# Patient Record
Sex: Male | Born: 1940 | Race: White | Hispanic: No | Marital: Single | Smoking: Former smoker
Health system: Southern US, Community
[De-identification: ages and names within clinical notes are randomized; demographics above are authoritative.]

## PROBLEM LIST (undated history)

## (undated) DIAGNOSIS — I10 Essential (primary) hypertension: Secondary | ICD-10-CM

## (undated) DIAGNOSIS — C140 Malignant neoplasm of pharynx, unspecified: Secondary | ICD-10-CM

## (undated) DIAGNOSIS — R7303 Prediabetes: Secondary | ICD-10-CM

## (undated) DIAGNOSIS — E785 Hyperlipidemia, unspecified: Secondary | ICD-10-CM

## (undated) DIAGNOSIS — I2510 Atherosclerotic heart disease of native coronary artery without angina pectoris: Secondary | ICD-10-CM

## (undated) HISTORY — PX: THROAT SURGERY: SHX803

## (undated) HISTORY — DX: Prediabetes: R73.03

## (undated) HISTORY — DX: Atherosclerotic heart disease of native coronary artery without angina pectoris: I25.10

---

## 2003-01-24 ENCOUNTER — Emergency Department (HOSPITAL_COMMUNITY): Admission: EM | Admit: 2003-01-24 | Discharge: 2003-01-24 | Payer: Self-pay | Admitting: Emergency Medicine

## 2012-04-25 ENCOUNTER — Encounter (HOSPITAL_COMMUNITY): Payer: Self-pay | Admitting: Emergency Medicine

## 2012-04-25 ENCOUNTER — Emergency Department (HOSPITAL_COMMUNITY)
Admission: EM | Admit: 2012-04-25 | Discharge: 2012-04-25 | Disposition: A | Discharge status: Home / Self Care | Payer: No Typology Code available for payment source | Attending: Emergency Medicine | Admitting: Emergency Medicine

## 2012-04-25 ENCOUNTER — Emergency Department (HOSPITAL_COMMUNITY): Payer: No Typology Code available for payment source

## 2012-04-25 DIAGNOSIS — M542 Cervicalgia: Secondary | ICD-10-CM | POA: Insufficient documentation

## 2012-04-25 DIAGNOSIS — Z79899 Other long term (current) drug therapy: Secondary | ICD-10-CM | POA: Insufficient documentation

## 2012-04-25 DIAGNOSIS — M5380 Other specified dorsopathies, site unspecified: Secondary | ICD-10-CM | POA: Insufficient documentation

## 2012-04-25 DIAGNOSIS — I10 Essential (primary) hypertension: Secondary | ICD-10-CM | POA: Insufficient documentation

## 2012-04-25 DIAGNOSIS — M62830 Muscle spasm of back: Secondary | ICD-10-CM

## 2012-04-25 DIAGNOSIS — Z87891 Personal history of nicotine dependence: Secondary | ICD-10-CM | POA: Insufficient documentation

## 2012-04-25 DIAGNOSIS — E785 Hyperlipidemia, unspecified: Secondary | ICD-10-CM | POA: Insufficient documentation

## 2012-04-25 DIAGNOSIS — Z8589 Personal history of malignant neoplasm of other organs and systems: Secondary | ICD-10-CM | POA: Insufficient documentation

## 2012-04-25 DIAGNOSIS — M62838 Other muscle spasm: Secondary | ICD-10-CM | POA: Insufficient documentation

## 2012-04-25 HISTORY — DX: Essential (primary) hypertension: I10

## 2012-04-25 HISTORY — DX: Malignant neoplasm of pharynx, unspecified: C14.0

## 2012-04-25 HISTORY — DX: Hyperlipidemia, unspecified: E78.5

## 2012-04-25 MED ORDER — OXYCODONE-ACETAMINOPHEN 5-325 MG PO TABS: 1.0000 | ORAL_TABLET | ORAL | Status: AC | PRN

## 2012-04-25 MED ORDER — DIAZEPAM 5 MG PO TABS: 5.0000 mg | ORAL_TABLET | Freq: Four times a day (QID) | ORAL | Status: AC | PRN
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
## 2012-04-25 NOTE — ED Provider Notes (Signed)
Medical screening examination/treatment/procedure(s) were performed by non-physician practitioner and as supervising physician I was immediately available for consultation/collaboration.    Labresha Mellor D Choya Tornow, MD 04/25/12 2350 

## 2013-01-08 ENCOUNTER — Telehealth: Payer: Self-pay | Admitting: Family Medicine

## 2013-01-08 MED ORDER — ALPRAZOLAM 0.5 MG PO TABS: 0.5000 mg | ORAL_TABLET | Freq: Three times a day (TID) | ORAL | Status: DC | PRN

## 2013-01-08 NOTE — Telephone Encounter (Signed)
Pt is starting to have swelling in feet, ankle, and legs. Should he go back on HCTZ? He does not need it called in if so, because he has plenty left over from when you stopped him from taking it.

## 2013-01-08 NOTE — Telephone Encounter (Signed)
Tell him to resume HCTZ, but he needs to be seen if this does not improve.

## 2013-01-09 NOTE — Telephone Encounter (Signed)
Pts dtr aware

## 2013-01-10 NOTE — Telephone Encounter (Signed)
done

## 2013-01-26 ENCOUNTER — Encounter: Payer: Self-pay | Admitting: Family Medicine

## 2013-01-26 DIAGNOSIS — E785 Hyperlipidemia, unspecified: Secondary | ICD-10-CM | POA: Insufficient documentation

## 2013-01-26 DIAGNOSIS — C140 Malignant neoplasm of pharynx, unspecified: Secondary | ICD-10-CM | POA: Insufficient documentation

## 2013-01-26 DIAGNOSIS — I10 Essential (primary) hypertension: Secondary | ICD-10-CM | POA: Insufficient documentation

## 2013-01-26 DIAGNOSIS — I509 Heart failure, unspecified: Secondary | ICD-10-CM | POA: Insufficient documentation

## 2013-01-26 DIAGNOSIS — I2510 Atherosclerotic heart disease of native coronary artery without angina pectoris: Secondary | ICD-10-CM | POA: Insufficient documentation

## 2013-01-28 ENCOUNTER — Ambulatory Visit: Payer: Self-pay | Admitting: Family Medicine

## 2013-02-15 ENCOUNTER — Encounter: Payer: Self-pay | Admitting: Family Medicine

## 2013-02-15 ENCOUNTER — Ambulatory Visit (INDEPENDENT_AMBULATORY_CARE_PROVIDER_SITE_OTHER): Payer: Self-pay | Admitting: Family Medicine

## 2013-02-15 VITALS — BP 142/80 | HR 80 | Temp 98.1°F | Resp 16 | Wt 218.0 lb

## 2013-02-15 DIAGNOSIS — I10 Essential (primary) hypertension: Secondary | ICD-10-CM

## 2013-02-15 NOTE — Progress Notes (Signed)
  Subjective:    Patient ID: Eddie Wilson, male    DOB: 03/16/1941, 72 y.o.   MRN: [id number]  HPI Patient comes in today with fluctuating blood pressures.  He states in between 140-150 systolic in the morning. He then rapidly improves. His blood pressures tend to range 1:30 to 140s over 80s. He denies any chest pain, shortness of breath or dyspnea on exertion. His highest right he takes his medicines. This is always first thing in the morning. This is also right after he wakes up. Past Medical History  Diagnosis Date  . Throat cancer   . Hyperlipemia   . Hypertension   . CAD (coronary artery disease)    Current Outpatient Prescriptions on File Prior to Visit  Medication Sig Dispense Refill  . ALPRAZolam (XANAX) 0.5 MG tablet Take 1 tablet (0.5 mg total) by mouth 3 (three) times daily as needed.  90 tablet  1  . aspirin 325 MG buffered tablet Take 325 mg by mouth daily.      Marland Kitchen docusate sodium (COLACE) 100 MG capsule Take 100 mg by mouth daily.      . hydrochlorothiazide (MICROZIDE) 12.5 MG capsule Take 12.5 mg by mouth daily.      Marland Kitchen lisinopril (PRINIVIL,ZESTRIL) 10 MG tablet Take 10 mg by mouth daily.      . simvastatin (ZOCOR) 20 MG tablet Take 20 mg by mouth every evening.       No current facility-administered medications on file prior to visit.   No Known Allergies    Review of Systems Review of systems is otherwise negative    Objective:   Physical Exam  Constitutional: He appears well-developed and well-nourished.  Cardiovascular: Normal rate and normal heart sounds.   No murmur heard. Pulmonary/Chest: Effort normal and breath sounds normal. No respiratory distress. He has no wheezes. He has no rales. He exhibits no tenderness.  Abdominal: Soft. Bowel sounds are normal.   Patient is status post numerous surgeries on his neck for throat cancer. He has a trach. He has bilateral carotid bruits.  He speaks with an electronic vocal assist device.       Assessment & Plan:  HTN  (hypertension)  Blood pressure is borderline controlled. However given his significant carotid stenosis due to the history of surgery and radiation in the neck I fear low blood pressure more than a mildly elevated blood pressure.  Therefore I asked him to continue his present medications at their current dosages. His goal blood pressure is between 1:30 and 145/80-90.

## 2013-03-13 ENCOUNTER — Telehealth: Payer: Self-pay | Admitting: Family Medicine

## 2013-03-13 ENCOUNTER — Telehealth: Payer: Self-pay | Admitting: *Deleted

## 2013-03-13 NOTE — Telephone Encounter (Signed)
Dtr is aware of message

## 2013-03-13 NOTE — Telephone Encounter (Signed)
Pt is having hairloss now for 2/3 weeks and pt is going crazy over it and wants to know what he can do about it.he talked to a pharmacist and they told him to take iron,vit a  And biotin and still doesn't work. Is there something you can prescribe for him?

## 2013-03-13 NOTE — Telephone Encounter (Signed)
Over the counter rogaine will help slow or stop the hair loss.  If it persists, come in for TSH, ANA and to discuss propecia.

## 2013-03-14 NOTE — Telephone Encounter (Signed)
Pt is aware.  

## 2013-03-14 NOTE — Telephone Encounter (Signed)
otc ogaine will slow hair loss.  If persistant, come in for tsh  And to discuss propecia.

## 2013-03-19 ENCOUNTER — Telehealth: Payer: Self-pay | Admitting: Family Medicine

## 2013-03-19 NOTE — Telephone Encounter (Signed)
?   OK to Refill  

## 2013-03-19 NOTE — Telephone Encounter (Signed)
Ok to refill 

## 2013-03-20 MED ORDER — ALPRAZOLAM 0.5 MG PO TABS: 0.5000 mg | ORAL_TABLET | Freq: Three times a day (TID) | ORAL | Status: DC | PRN

## 2013-03-20 NOTE — Telephone Encounter (Signed)
Rx Refilled  

## 2013-03-21 ENCOUNTER — Encounter: Payer: Self-pay | Admitting: Family Medicine

## 2013-03-21 ENCOUNTER — Ambulatory Visit (INDEPENDENT_AMBULATORY_CARE_PROVIDER_SITE_OTHER): Payer: Self-pay | Admitting: Family Medicine

## 2013-03-21 VITALS — BP 126/90 | HR 78 | Temp 98.0°F | Resp 18 | Wt 200.0 lb

## 2013-03-21 DIAGNOSIS — L658 Other specified nonscarring hair loss: Secondary | ICD-10-CM

## 2013-03-21 DIAGNOSIS — L649 Androgenic alopecia, unspecified: Secondary | ICD-10-CM

## 2013-03-21 NOTE — Progress Notes (Signed)
  Subjective:    Patient ID: Eddie Wilson, male    DOB: 09/26/1941, 72 y.o.   MRN: [id number]  HPI Patient is a 72 year old white male who has noticed increasing hair loss on the crown of his head. He states when he turns his hair he is finding substantial amount of hair in his brush. 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 There is thinning of his hair in the anterior scalp. He is also brought on his crown. However he is reporting some thinning of the hair on his temples as well. He denies any recent weight change, fevers, severe illness, or increased stress. Past Medical History  Diagnosis Date  . Throat cancer   . Hyperlipemia   . Hypertension   . CAD (coronary artery disease)    Current Outpatient Prescriptions on File Prior to Visit  Medication Sig Dispense Refill  . ALPRAZolam (XANAX) 0.5 MG tablet Take 1 tablet (0.5 mg total) by mouth 3 (three) times daily as needed.  90 tablet  0  . aspirin 325 MG buffered tablet Take 325 mg by mouth daily.      Marland Kitchen docusate sodium (COLACE) 100 MG capsule Take 100 mg by mouth daily.      . hydrochlorothiazide (MICROZIDE) 12.5 MG capsule Take 12.5 mg by mouth daily.      Marland Kitchen lisinopril (PRINIVIL,ZESTRIL) 10 MG tablet Take 10 mg by mouth daily.      . metoprolol tartrate (LOPRESSOR) 25 MG tablet Take 25 mg by mouth 2 (two) times daily.      . simvastatin (ZOCOR) 20 MG tablet Take 20 mg by mouth every evening.       No current facility-administered medications on file prior to visit.   No Known Allergies History   Social History  . Marital Status: Single    Spouse Name: N/A    Number of Children: N/A  . Years of Education: N/A   Occupational History  . Not on file.   Social History Main Topics  . Smoking status: Former Games developer  . Smokeless tobacco: Not on file  . Alcohol Use: No  . Drug Use: Not on file  . Sexually Active: Not on file   Other Topics Concern  . Not on file   Social History Narrative  . No narrative on file       Review of Systems  All other systems reviewed and are negative.       Objective:   Physical Exam  Constitutional: He appears well-developed and well-nourished.  Cardiovascular: Normal rate and normal heart sounds.   Pulmonary/Chest: Effort normal and breath sounds normal.  He has thinning hair on the anterior portion of his scalp. He is bald on his occiput.         Assessment & Plan:  1. Androgenic alopecia Recommended that he apply Rogaine to his scalp at night. If the hair loss persists, we can try oral Propecia. Recommend checking a TSH if the hair loss worsens.

## 2013-03-29 ENCOUNTER — Telehealth: Payer: Self-pay | Admitting: Family Medicine

## 2013-03-29 NOTE — Telephone Encounter (Signed)
Pt called back and said nevermind. He misunderstood what the other dr said.

## 2013-04-01 NOTE — Telephone Encounter (Signed)
i would not recommend an ekg machine.  I think a simple bp cuff that can check his pulse would be sufficient.

## 2013-04-04 ENCOUNTER — Telehealth: Payer: Self-pay | Admitting: Family Medicine

## 2013-04-04 NOTE — Telephone Encounter (Signed)
Pt's heart rate had gone up in the 90's and was concerned. Expressed to pt that 60-100 was normal range however if it gets over 100 and he feels really bad/weird please seek medical attention ASAP.

## 2013-04-24 ENCOUNTER — Telehealth: Payer: Self-pay | Admitting: Family Medicine

## 2013-04-24 NOTE — Telephone Encounter (Signed)
Approved. # 90 plus one additional refill 

## 2013-04-24 NOTE — Telephone Encounter (Signed)
?   OK to Refill  

## 2013-04-25 MED ORDER — ALPRAZOLAM 0.5 MG PO TABS: 0.5000 mg | ORAL_TABLET | Freq: Three times a day (TID) | ORAL | Status: DC | PRN

## 2013-04-25 NOTE — Telephone Encounter (Signed)
Rx Refilled  

## 2013-05-20 ENCOUNTER — Encounter: Payer: Self-pay | Admitting: Family Medicine

## 2013-05-20 ENCOUNTER — Ambulatory Visit (INDEPENDENT_AMBULATORY_CARE_PROVIDER_SITE_OTHER): Payer: Medicare Other | Admitting: Family Medicine

## 2013-05-20 VITALS — BP 110/68 | HR 86 | Temp 98.2°F | Resp 20 | Wt 220.0 lb

## 2013-05-20 DIAGNOSIS — J209 Acute bronchitis, unspecified: Secondary | ICD-10-CM

## 2013-05-20 DIAGNOSIS — L570 Actinic keratosis: Secondary | ICD-10-CM

## 2013-05-20 NOTE — Progress Notes (Signed)
Subjective:    Patient ID: Eddie Wilson, male    DOB: 1941/06/06, 72 y.o.   MRN: [id number]  HPI Patient reports one week of cough. His cough is productive of a trace amount of hemoptysis which is common for this patient when he gets bronchitis due to his history of throat cancer and radiation to his neck. He denies any fever, shortness of breath, chest pain, pleurisy, wheezing. He's been getting better since taking guaifenesin.  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 This was performed 3 weeks ago. However a lesion on his right foot on and on the dorsum of his left hand have persisted. 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 Past Medical History  Diagnosis Date  . Throat cancer   . Hyperlipemia   . Hypertension   . CAD (coronary artery disease)    Current Outpatient Prescriptions on File Prior to Visit  Medication Sig Dispense Refill  . ALPRAZolam (XANAX) 0.5 MG tablet Take 1 tablet (0.5 mg total) by mouth 3 (three) times daily as needed.  90 tablet  1  . aspirin 325 MG buffered tablet Take 325 mg by mouth daily.      Marland Kitchen docusate sodium (COLACE) 100 MG capsule Take 100 mg by mouth daily.      . hydrochlorothiazide (MICROZIDE) 12.5 MG capsule Take 12.5 mg by mouth daily.      Marland Kitchen lisinopril (PRINIVIL,ZESTRIL) 10 MG tablet Take 10 mg by mouth daily.      . metoprolol tartrate (LOPRESSOR) 25 MG tablet Take 25 mg by mouth 2 (two) times daily.      . simvastatin (ZOCOR) 20 MG tablet Take 20 mg by mouth every evening.       No current facility-administered medications on file prior to visit.   No Known Allergies History   Social History  . Marital Status: Single    Spouse Name: N/A    Number of Children: N/A  . Years of Education: N/A   Occupational History  . Not on file.   Social History Main Topics   . Smoking status: Former Games developer  . Smokeless tobacco: Not on file  . Alcohol Use: No  . Drug Use: Not on file  . Sexually Active: Not on file   Other Topics Concern  . Not on file   Social History Narrative  . No narrative on file      Review of Systems  All other systems reviewed and are negative.       Objective:   Physical Exam  Vitals reviewed. Constitutional: He appears well-developed and well-nourished.  HENT:  Right Ear: External ear normal.  Left Ear: External ear normal.  Nose: Nose normal.  Mouth/Throat: Oropharynx is clear and moist. No oropharyngeal exudate.  Neck: Neck supple. No thyromegaly present.  Cardiovascular: Normal rate, regular rhythm and normal heart sounds.   No murmur heard. Pulmonary/Chest: Effort normal and breath sounds normal. No respiratory distress. He has no wheezes. He has no rales. He exhibits no tenderness.  Lymphadenopathy:    He has no cervical adenopathy.   4 mm right scaly papule on the dorsum of the left hand. 4 mm hard right scaly papule on the dorsum of the right forearm.       Assessment & Plan:  1. Actinic keratosis Cryotherapy was applied to each of the  2 lesions described above in the physical exam total of 20 seconds each. Wound care was discussed. If the lesions persist they will require a shave biopsy  2. Acute bronchitis Improving. Likely viral in etiology. Recommended Mucinex 600 mg tablets one to 2 tablets by mouth every 12 hours when necessary cough. He is to follow up if he develops fever chest pain or shortness of breath or if symptoms persist. The patient reports he had a normal chest x-ray yesterday at the [place].

## 2013-05-28 ENCOUNTER — Telehealth: Payer: Self-pay | Admitting: Family Medicine

## 2013-05-28 MED ORDER — PREDNISONE 20 MG PO TABS: ORAL_TABLET | ORAL | Status: DC

## 2013-05-28 NOTE — Telephone Encounter (Signed)
Med sent to pharm. And pt aware

## 2013-05-28 NOTE — Telephone Encounter (Signed)
Ok with prednisone taper pack. 
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
## 2013-06-27 ENCOUNTER — Encounter: Payer: Self-pay | Admitting: Family Medicine

## 2013-06-27 ENCOUNTER — Ambulatory Visit (INDEPENDENT_AMBULATORY_CARE_PROVIDER_SITE_OTHER): Payer: Medicare Other | Admitting: Family Medicine

## 2013-06-27 VITALS — BP 124/62 | HR 82 | Temp 97.7°F | Resp 18 | Wt 216.0 lb

## 2013-06-27 DIAGNOSIS — D489 Neoplasm of uncertain behavior, unspecified: Secondary | ICD-10-CM

## 2013-06-27 NOTE — Progress Notes (Signed)
  Subjective:    Patient ID: Eddie Wilson, male    DOB: 1941-08-08, 72 y.o.   MRN: [id number]  HPI Patient has had a lesion on his right forearm for over a year. He has received cryotherapy on 2 separate occasions at the [place]. He continues to grow and bleed. It is 1 cm erythematous and scaly. It is located on the dorsal surface of his right forearm. He also has a scaly red macule on the dorsal surface of his right hand that he wants treated. Past Medical History  Diagnosis Date  . Throat cancer   . Hyperlipemia   . Hypertension   . CAD (coronary artery disease)    Current Outpatient Prescriptions on File Prior to Visit  Medication Sig Dispense Refill  . ALPRAZolam (XANAX) 0.5 MG tablet Take 1 tablet (0.5 mg total) by mouth 3 (three) times daily as needed.  90 tablet  1  . aspirin 325 MG buffered tablet Take 325 mg by mouth daily.      Marland Kitchen docusate sodium (COLACE) 100 MG capsule Take 100 mg by mouth daily.      . hydrochlorothiazide (MICROZIDE) 12.5 MG capsule Take 12.5 mg by mouth daily.      Marland Kitchen lisinopril (PRINIVIL,ZESTRIL) 10 MG tablet Take 10 mg by mouth daily.      . metoprolol tartrate (LOPRESSOR) 25 MG tablet Take 25 mg by mouth 2 (two) times daily.      . predniSONE (DELTASONE) 20 MG tablet 3 tabs po qd x 2 days, 2 tabs po qd x 2 days, 1 tab po qd x 2 days  12 tablet  0  . simvastatin (ZOCOR) 20 MG tablet Take 20 mg by mouth every evening.       No current facility-administered medications on file prior to visit.   No Known Allergies History   Social History  . Marital Status: Single    Spouse Name: N/A    Number of Children: N/A  . Years of Education: N/A   Occupational History  . Not on file.   Social History Main Topics  . Smoking status: Former Games developer  . Smokeless tobacco: Not on file  . Alcohol Use: No  . Drug Use: Not on file  . Sexual Activity: Not on file   Other Topics Concern  . Not on file   Social History Narrative  . No narrative on file      Review of  Systems  All other systems reviewed and are negative.       Objective:   Physical Exam  Vitals reviewed. Constitutional: He appears well-developed and well-nourished.  Cardiovascular: Normal rate and regular rhythm.   Pulmonary/Chest: Effort normal and breath sounds normal. No respiratory distress.   1 cm erythematous scaly papule on the dorsal surface of his right forearm. 7 mm erythematous scaly macule on the dorsal surface of his left hand the        Assessment & Plan:  1. Neoplasm of uncertain behavior The area on the dorsal surface of his right forearm was anesthetized with 0.1% lidocaine with epinephrine. A shave biopsy was performed and the lesion was sent to pathology and labeled container. Hemostasis was achieved with Drysol .The lesion on the dorsal surface of the right hand was treated with cryotherapy with liquid nitrogen for 30 seconds. Wound care was discussed. Await pathology results - Pathology

## 2013-07-03 ENCOUNTER — Telehealth: Payer: Self-pay | Admitting: Family Medicine

## 2013-07-03 NOTE — Telephone Encounter (Signed)
?   OK to Refill  

## 2013-07-04 MED ORDER — ALPRAZOLAM 0.5 MG PO TABS: 0.5000 mg | ORAL_TABLET | Freq: Three times a day (TID) | ORAL | Status: DC | PRN

## 2013-07-04 NOTE — Telephone Encounter (Signed)
Ok with 2 refills

## 2013-07-04 NOTE — Addendum Note (Signed)
Addended by: Legrand Rams B on: 07/04/2013 11:50 AM   Modules accepted: Orders

## 2013-07-04 NOTE — Telephone Encounter (Signed)
Rx Refilled  

## 2013-08-03 ENCOUNTER — Telehealth: Payer: Self-pay | Admitting: Family Medicine

## 2013-08-03 NOTE — Telephone Encounter (Signed)
Alprazolam 0.5mg  take one tablet by mouth TID prn #90

## 2013-08-04 NOTE — Telephone Encounter (Signed)
ok 

## 2013-08-05 ENCOUNTER — Telehealth: Payer: Self-pay | Admitting: Family Medicine

## 2013-08-05 MED ORDER — ALPRAZOLAM 0.5 MG PO TABS: 0.5000 mg | ORAL_TABLET | Freq: Three times a day (TID) | ORAL | Status: DC | PRN

## 2013-08-05 NOTE — Telephone Encounter (Signed)
Alprazolam 0.5 mg TID PRN.  Last RF 07/04/13  #90  OK Refill?

## 2013-08-05 NOTE — Telephone Encounter (Signed)
Rx called in 

## 2013-08-05 NOTE — Telephone Encounter (Signed)
ok 

## 2013-10-07 ENCOUNTER — Other Ambulatory Visit: Payer: Self-pay | Admitting: Family Medicine

## 2013-10-07 MED ORDER — ALPRAZOLAM 0.5 MG PO TABS: 0.5000 mg | ORAL_TABLET | Freq: Three times a day (TID) | ORAL | Status: DC | PRN

## 2013-10-07 NOTE — Telephone Encounter (Signed)
Rx Refilled  

## 2013-11-21 ENCOUNTER — Telehealth: Payer: Self-pay | Admitting: Family Medicine

## 2013-11-21 MED ORDER — LISINOPRIL 10 MG PO TABS: 10.0000 mg | ORAL_TABLET | Freq: Every day | ORAL | Status: DC

## 2013-11-21 NOTE — Telephone Encounter (Signed)
Pt is needing a refill on his Lisinopril 10 MG Call back number is [place] on [address]

## 2013-11-21 NOTE — Telephone Encounter (Signed)
Rx Refilled  

## 2013-12-20 ENCOUNTER — Telehealth: Payer: Self-pay | Admitting: Family Medicine

## 2013-12-20 MED ORDER — ALPRAZOLAM 0.5 MG PO TABS: 0.5000 mg | ORAL_TABLET | Freq: Three times a day (TID) | ORAL | Status: DC | PRN

## 2013-12-20 NOTE — Telephone Encounter (Signed)
ok 

## 2013-12-20 NOTE — Telephone Encounter (Signed)
Last Rf Alprazolam 12/15 #90 +2.  Last OV 05/20/13.

## 2013-12-20 NOTE — Telephone Encounter (Signed)
Rx printed and faxed to pharmacy. 

## 2014-01-06 ENCOUNTER — Ambulatory Visit: Payer: Self-pay | Admitting: Family Medicine

## 2014-01-27 ENCOUNTER — Ambulatory Visit: Payer: Self-pay | Admitting: Family Medicine

## 2014-01-31 ENCOUNTER — Encounter: Payer: Self-pay | Admitting: Family Medicine

## 2014-01-31 ENCOUNTER — Ambulatory Visit (INDEPENDENT_AMBULATORY_CARE_PROVIDER_SITE_OTHER): Payer: Medicare Other | Admitting: Family Medicine

## 2014-01-31 VITALS — BP 126/64 | HR 72 | Temp 97.0°F | Resp 18 | Ht 72.0 in | Wt 218.0 lb

## 2014-01-31 DIAGNOSIS — L0390 Cellulitis, unspecified: Secondary | ICD-10-CM

## 2014-01-31 DIAGNOSIS — L0291 Cutaneous abscess, unspecified: Secondary | ICD-10-CM

## 2014-01-31 DIAGNOSIS — J309 Allergic rhinitis, unspecified: Secondary | ICD-10-CM

## 2014-01-31 MED ORDER — FLUTICASONE PROPIONATE 50 MCG/ACT NA SUSP: 2.0000 | Freq: Every day | NASAL | Status: DC

## 2014-01-31 MED ORDER — SULFAMETHOXAZOLE-TMP DS 800-160 MG PO TABS: 1.0000 | ORAL_TABLET | Freq: Two times a day (BID) | ORAL | Status: DC

## 2014-01-31 NOTE — Progress Notes (Signed)
   Subjective:    Patient ID: Eddie Wilson, male    DOB: 05-07-1941, 73 y.o.   MRN: [id number]  HPI Patient has been having itchy watery eyes and rhinorrhea and nasal congestion for several weeks. He also has a tender erythematous nodule at the entrance of his left nostril. There is no fluctuance. There is no obvious abscess. It does appear infected like it could be an evolving abscess.  He is 5 mm in size and extremely tender to palpation. Past Medical History  Diagnosis Date  . Throat cancer   . Hyperlipemia   . Hypertension   . CAD (coronary artery disease)    Current Outpatient Prescriptions on File Prior to Visit  Medication Sig Dispense Refill  . ALPRAZolam (XANAX) 0.5 MG tablet Take 1 tablet (0.5 mg total) by mouth 3 (three) times daily as needed.  90 tablet  2  . aspirin 325 MG buffered tablet Take 325 mg by mouth daily.      Marland Kitchen docusate sodium (COLACE) 100 MG capsule Take 100 mg by mouth daily.      . hydrochlorothiazide (MICROZIDE) 12.5 MG capsule Take 12.5 mg by mouth daily.      Marland Kitchen lisinopril (PRINIVIL,ZESTRIL) 10 MG tablet Take 1 tablet (10 mg total) by mouth daily.  30 tablet  11  . metoprolol tartrate (LOPRESSOR) 25 MG tablet Take 25 mg by mouth 2 (two) times daily.      . simvastatin (ZOCOR) 20 MG tablet Take 20 mg by mouth every evening.       No current facility-administered medications on file prior to visit.   No Known Allergies History   Social History  . Marital Status: Single    Spouse Name: N/A    Number of Children: N/A  . Years of Education: N/A   Occupational History  . Not on file.   Social History Main Topics  . Smoking status: Former Research scientist (life sciences)  . Smokeless tobacco: Not on file  . Alcohol Use: No  . Drug Use: Not on file  . Sexual Activity: Not on file   Other Topics Concern  . Not on file   Social History Narrative  . No narrative on file      Review of Systems  All other systems reviewed and are negative.      Objective:   Physical Exam   Vitals reviewed. Constitutional: He appears well-developed and well-nourished.  HENT:  Head: Normocephalic and atraumatic.  Right Ear: External ear normal.  Left Ear: External ear normal.  Nose: Mucosal edema, rhinorrhea and sinus tenderness present.  Mouth/Throat: Oropharynx is clear and moist. No oropharyngeal exudate.  Tender erythematous 5 mm nodule at the entrance of the left nostril        Assessment & Plan:  1. Cellulitis Warm compresses applied twice a day for the next 5 days. Bactrim strength tablet 1 by mouth twice a day for 7 days. - sulfamethoxazole-trimethoprim (BACTRIM DS) 800-160 MG per tablet; Take 1 tablet by mouth 2 (two) times daily.  Dispense: 14 tablet; Refill: 0  2. Allergic rhinitis - fluticasone (FLONASE) 50 MCG/ACT nasal spray; Place 2 sprays into both nostrils daily.  Dispense: 16 g; Refill: 2

## 2014-03-27 ENCOUNTER — Other Ambulatory Visit: Payer: Self-pay | Admitting: Family Medicine

## 2014-03-27 MED ORDER — METOPROLOL TARTRATE 25 MG PO TABS: 25.0000 mg | ORAL_TABLET | Freq: Two times a day (BID) | ORAL | Status: DC

## 2014-03-28 ENCOUNTER — Telehealth: Payer: Self-pay | Admitting: *Deleted

## 2014-03-28 NOTE — Telephone Encounter (Signed)
Hctz will not likely help localized swelling on the top of the foot.

## 2014-03-28 NOTE — Telephone Encounter (Signed)
Pt called wanting to know if he can increase his HCTZ 12.5mg  d/t Left foot swelling on top of foot wants to increase it to 25mg . MD please advise

## 2014-03-28 NOTE — Telephone Encounter (Signed)
Linda aware.

## 2014-04-22 ENCOUNTER — Telehealth: Payer: Self-pay | Admitting: Family Medicine

## 2014-04-22 ENCOUNTER — Telehealth: Payer: Self-pay | Admitting: *Deleted

## 2014-04-22 MED ORDER — ALPRAZOLAM 0.5 MG PO TABS: 0.5000 mg | ORAL_TABLET | Freq: Three times a day (TID) | ORAL | Status: DC | PRN

## 2014-04-22 NOTE — Telephone Encounter (Signed)
ok 

## 2014-04-22 NOTE — Telephone Encounter (Signed)
Ok to refill Xanax??  Last office visit 01/31/2014.  Last refill 12/20/2013.

## 2014-04-22 NOTE — Telephone Encounter (Signed)
Pharmacy request Rf Alprazolam 0.5 mg.  Last filled per pharm 03/18/14. #90.  OK refill?

## 2014-04-22 NOTE — Telephone Encounter (Signed)
Medication called to pharmacy. 

## 2014-04-23 NOTE — Telephone Encounter (Signed)
Medication called to pharmacy on 04/22/2014.

## 2014-04-28 ENCOUNTER — Ambulatory Visit (INDEPENDENT_AMBULATORY_CARE_PROVIDER_SITE_OTHER): Payer: Medicare Other | Admitting: Family Medicine

## 2014-04-28 ENCOUNTER — Encounter: Payer: Self-pay | Admitting: Family Medicine

## 2014-04-28 VITALS — BP 160/86 | HR 60 | Temp 97.4°F | Resp 18 | Ht 72.0 in | Wt 222.0 lb

## 2014-04-28 DIAGNOSIS — I10 Essential (primary) hypertension: Secondary | ICD-10-CM

## 2014-04-28 NOTE — Progress Notes (Signed)
Subjective:    Patient ID: Eddie Wilson, male    DOB: February 12, 1941, 73 y.o.   MRN: [id number]  HPI Patient has a history of coronary artery disease status post myocardial infarction in the remote past. He recently had a stress test performed at the [facility] that was abnormal. He was referred to the [facility] in [place] where they performed a catheterization.  Per the patient's report his coronary arteries were completely clear.  However at the time his blood pressure was elevated at 180/90 in the cardiologist doubled his hydrochlorothiazide to 25 mg and doubled his lisinopril to 20 mg a day. The patient states he checks his blood pressure times a day on average. His blood pressure typically runs 110-120/50-60. Whenever he gets anxious or nervous his blood pressure can become quite elevated although this is temporary. He states that when he returned home on the higher dose of his blood pressure medication, his blood pressure dropped into the 70's over 40s and he almost passed out.  He is subsequently decreased his hydrochlorothiazide to 12.5 mg a day and lisinopril back to 10 mg a day. His blood pressure here today is elevated at 160/86. I verified this myself. However the patient states that he was rushing to get here on time. He also states that his girlfriend has recently been diagnosed with cancer and this has been very worried. He is adamant that his blood pressure is much better at home. Furthermore he is febrile blood pressure cuffs and they all agree. Past Medical History  Diagnosis Date  . Throat cancer   . Hyperlipemia   . Hypertension   . CAD (coronary artery disease)    Past Surgical History  Procedure Laterality Date  . Throat surgery     Current Outpatient Prescriptions on File Prior to Visit  Medication Sig Dispense Refill  . ALPRAZolam (XANAX) 0.5 MG tablet Take 1 tablet (0.5 mg total) by mouth 3 (three) times daily as needed.  90 tablet  2  . aspirin 325 MG buffered tablet Take 325 mg by  mouth daily.      Marland Kitchen docusate sodium (COLACE) 100 MG capsule Take 100 mg by mouth daily.      . fluticasone (FLONASE) 50 MCG/ACT nasal spray Place 2 sprays into both nostrils daily.  16 g  2  . hydrochlorothiazide (MICROZIDE) 12.5 MG capsule Take 12.5 mg by mouth daily.      Marland Kitchen lisinopril (PRINIVIL,ZESTRIL) 10 MG tablet Take 1 tablet (10 mg total) by mouth daily.  30 tablet  11  . metoprolol tartrate (LOPRESSOR) 25 MG tablet Take 1 tablet (25 mg total) by mouth 2 (two) times daily.  180 tablet  3  . simvastatin (ZOCOR) 20 MG tablet Take 20 mg by mouth every evening.       No current facility-administered medications on file prior to visit.   No Known Allergies History   Social History  . Marital Status: Single    Spouse Name: N/A    Number of Children: N/A  . Years of Education: N/A   Occupational History  . Not on file.   Social History Main Topics  . Smoking status: Former Research scientist (life sciences)  . Smokeless tobacco: Not on file  . Alcohol Use: No  . Drug Use: Not on file  . Sexual Activity: Not on file   Other Topics Concern  . Not on file   Social History Narrative  . No narrative on file      Review of Systems  All other systems reviewed and are negative.      Objective:   Physical Exam  Vitals reviewed. Constitutional: He appears well-developed and well-nourished.  Cardiovascular: Normal rate, regular rhythm and normal heart sounds.   No murmur heard. Pulmonary/Chest: Effort normal and breath sounds normal. No respiratory distress. He has no wheezes. He has no rales.  Abdominal: Soft. Bowel sounds are normal. He exhibits no distension. There is no tenderness. There is no rebound and no guarding.  Musculoskeletal: He exhibits no edema.   patient has chronic surgical changes in his left neck and left jaw from his previous surgery for throat cancer. Electronic voice simulator.  EKG  normal sinus rhythm at 74 beats per minute with normal intervals and normal axis. He does have  delayed R-wave progression. He also has possible left atrial enlargement with a large P-wave in lead 2. There are nonspecific ST changes in lead 3. Otherwise, it is a normal EKG      Assessment & Plan:  1. Essential hypertension Patient's EKG is normal. Right palmar on record to monitor here this practice. His blood pressure today is elevated although the patient states this is an aberration. Patient checked his blood pressure Saturday and was 96/47.  He verified this twice throughout the day. Therefore I will not increase his blood pressure medication at present.  I would like him to monitor his blood pressure closely at home and notify me of the results in 2-3 weeks. His goal blood pressure is less than 140/90.  Patient states that his cholesterol was recently checked at the [facility] was normal. - EKG 12-Lead

## 2014-06-23 ENCOUNTER — Telehealth: Payer: Self-pay | Admitting: Family Medicine

## 2014-06-23 MED ORDER — ALPRAZOLAM 0.5 MG PO TABS: 0.5000 mg | ORAL_TABLET | Freq: Three times a day (TID) | ORAL | Status: DC | PRN

## 2014-06-23 NOTE — Telephone Encounter (Signed)
Med called to pharm and pt aware 

## 2014-06-23 NOTE — Telephone Encounter (Signed)
?   OK to Refill - I think he just needs a refill on this

## 2014-06-23 NOTE — Telephone Encounter (Signed)
Patient needs rx for xanax they took it too one pharmacy and it was too expensive so she needs new rx [phone] [facility]

## 2014-06-23 NOTE — Telephone Encounter (Signed)
ok 

## 2014-07-23 ENCOUNTER — Telehealth: Payer: Self-pay | Admitting: Family Medicine

## 2014-07-23 NOTE — Telephone Encounter (Signed)
Patients daughter is calling in regards to one of his prescriptions please call [phone]

## 2014-07-24 NOTE — Telephone Encounter (Signed)
Lmtrc

## 2014-07-30 ENCOUNTER — Telehealth: Payer: Self-pay | Admitting: Family Medicine

## 2014-07-30 NOTE — Telephone Encounter (Signed)
Spoke to pt and the MD at the [facility] increased his Lisinopril to 10mg  1.5 pills per day and he needs a new [facility]. OK to do? (pt does have an appt Friday)

## 2014-07-30 NOTE — Telephone Encounter (Signed)
[phone]  Eddie Wilson is needing to speak to you about pts medications lisinopril (PRINIVIL,ZESTRIL) 10 MG tablet

## 2014-07-31 MED ORDER — LISINOPRIL 10 MG PO TABS: 15.0000 mg | ORAL_TABLET | Freq: Every day | ORAL | Status: DC

## 2014-07-31 NOTE — Telephone Encounter (Signed)
ok 

## 2014-07-31 NOTE — Telephone Encounter (Signed)
Med sent to pharm for increased dose

## 2014-08-01 ENCOUNTER — Ambulatory Visit (INDEPENDENT_AMBULATORY_CARE_PROVIDER_SITE_OTHER): Payer: Medicare Other | Admitting: Family Medicine

## 2014-08-01 ENCOUNTER — Encounter: Payer: Self-pay | Admitting: Family Medicine

## 2014-08-01 VITALS — BP 118/74 | HR 74 | Temp 98.1°F | Resp 18 | Ht 72.0 in | Wt 217.0 lb

## 2014-08-01 DIAGNOSIS — L0100 Impetigo, unspecified: Secondary | ICD-10-CM

## 2014-08-01 DIAGNOSIS — R059 Cough, unspecified: Secondary | ICD-10-CM

## 2014-08-01 DIAGNOSIS — R05 Cough: Secondary | ICD-10-CM

## 2014-08-01 NOTE — Progress Notes (Signed)
Subjective:    Patient ID: Eddie Wilson, male    DOB: [birth year], 73 y.o.   MRN: [id number]  HPI Patient has an irritated area inside his left nostril for the last week. There is a small papule on the edge of his left nostril which is verruciform in nature.  However the skin around it is erythematous cracked and sore. The lesion in toto is approximately 7 mm in diameter. It appears to be impetigo secondarily infecting some type of skin papule. It is difficult to ascertain what the Pap is due to the medication. It could be a seborrheic keratosis although I cannot exclude neoplasm.  He also complains of chronic rhinorrhea and postnasal drip. He has had a cough productive of clear sputum for the last week. He denies any fever or shortness of breath or chest pain. Past Medical History  Diagnosis Date  . Throat cancer   . Hyperlipemia   . Hypertension   . CAD (coronary artery disease)    Past Surgical History  Procedure Laterality Date  . Throat surgery     Current Outpatient Prescriptions on File Prior to Visit  Medication Sig Dispense Refill  . ALPRAZolam (XANAX) 0.5 MG tablet Take 1 tablet (0.5 mg total) by mouth 3 (three) times daily as needed.  90 tablet  2  . aspirin 325 MG buffered tablet Take 325 mg by mouth daily.      Marland Kitchen docusate sodium (COLACE) 100 MG capsule Take 100 mg by mouth daily.      . fluticasone (FLONASE) 50 MCG/ACT nasal spray Place 2 sprays into both nostrils daily.  16 g  2  . hydrochlorothiazide (MICROZIDE) 12.5 MG capsule Take 12.5 mg by mouth daily.      Marland Kitchen lisinopril (PRINIVIL,ZESTRIL) 10 MG tablet Take 1.5 tablets (15 mg total) by mouth daily.  45 tablet  5  . metoprolol tartrate (LOPRESSOR) 25 MG tablet Take 1 tablet (25 mg total) by mouth 2 (two) times daily.  180 tablet  3  . simvastatin (ZOCOR) 20 MG tablet Take 20 mg by mouth every evening.       No current facility-administered medications on file prior to visit.   No Known Allergies History   Social  History  . Marital Status: Single    Spouse Name: N/A    Number of Children: N/A  . Years of Education: N/A   Occupational History  . Not on file.   Social History Main Topics  . Smoking status: Former Research scientist (life sciences)  . Smokeless tobacco: Not on file  . Alcohol Use: No  . Drug Use: Not on file  . Sexual Activity: Not on file   Other Topics Concern  . Not on file   Social History Narrative  . No narrative on file      Review of Systems  All other systems reviewed and are negative.      Objective:   Physical Exam  Vitals reviewed. HENT:  Nose: Mucosal edema and rhinorrhea present.  Mouth/Throat: Oropharynx is clear and moist.  Neck: Neck supple.  Cardiovascular: Normal rate, regular rhythm and normal heart sounds.   Pulmonary/Chest: Effort normal and breath sounds normal. No respiratory distress. He has no wheezes. He has no rales. He exhibits no tenderness.  Lymphadenopathy:    He has no cervical adenopathy.  Skin: Rash noted. There is erythema.   please see the description of the lesion in the left nostril in the history of present illness  Assessment & Plan:  Cough  Impetigo  I believe the patient has impetigo in the left nostril. I have recommended Bactroban twice a day for the next week. I would like to recheck the lesion then at that point. Consider a biopsy if the lesion appears to be pathologic. I believe the patient's cough is due to postnasal drip most likely from allergies. I have recommended Flonase 2 sprays each nostril daily. Recheck if cough is persistent in 2-3 weeks.

## 2014-09-03 ENCOUNTER — Telehealth: Payer: Self-pay | Admitting: Family Medicine

## 2014-09-03 NOTE — Telephone Encounter (Signed)
Eddie Wilson has a lot of congestion in chest we had given him amoxicillin  and prednisone, yesterday he was shortwinded. [name] is calling to ask why maybe he is having the shortness of breath  [phone]

## 2014-09-03 NOTE — Telephone Encounter (Signed)
LMTRC

## 2014-09-04 NOTE — Telephone Encounter (Signed)
Spoke to Eddie Wilson and pt is doing much better today and has been out and about some with less coughing and SOB. She did call and speak to the [facility] nurse last pm and was informed that it was normal that he was having those symptoms and to give the medication time to work.

## 2014-09-24 ENCOUNTER — Telehealth: Payer: Self-pay | Admitting: Family Medicine

## 2014-09-24 NOTE — Telephone Encounter (Signed)
Spoke to [place] and she stated that he does not have a fever just chest congestion and cough. He feels congested but can not get anything up. Advised her that pt needs to be seen to r/o pna. She agreed and will call pt and have him set up appt.

## 2014-09-24 NOTE — Telephone Encounter (Signed)
[phone] Vaughan Basta is calling for the pt states that he is needing antibiotic because he has congestion in his chest  [employer]

## 2014-10-08 ENCOUNTER — Other Ambulatory Visit: Payer: Self-pay | Admitting: *Deleted

## 2014-10-08 NOTE — Telephone Encounter (Signed)
Received fax requesting refill on Xanax.   Ok to refill??  Last office visit 04/28/2014.  Last refill 06/23/2014, #2 refills.

## 2014-10-09 ENCOUNTER — Telehealth: Payer: Self-pay | Admitting: Family Medicine

## 2014-10-09 MED ORDER — ALPRAZOLAM 0.5 MG PO TABS: 0.5000 mg | ORAL_TABLET | Freq: Three times a day (TID) | ORAL | Status: DC | PRN

## 2014-10-09 NOTE — Telephone Encounter (Signed)
Linda randall calling to ask some questions about the patient taking some doxycycline for the dry cough he is having [phone]

## 2014-10-09 NOTE — Telephone Encounter (Signed)
Eddie Wilson states that he just has a dry cough. Informed Eddie Wilson that he should not be taking any antibiotic for his dry cough unless seen by a provider b/c antibx are for infections and if he does not have an infection he does not need an antibx. Suggested that pt take mucinex for the dry cough and if that does not help he NTBS. She verbalizes understanding.

## 2014-10-09 NOTE — Telephone Encounter (Signed)
rx called in

## 2014-10-09 NOTE — Telephone Encounter (Signed)
What are her questions?

## 2014-10-09 NOTE — Telephone Encounter (Signed)
ok 

## 2014-11-05 ENCOUNTER — Telehealth: Payer: Self-pay | Admitting: *Deleted

## 2014-11-05 NOTE — Telephone Encounter (Signed)
Pt "friend" calling stating that pt is c/o congestion, tightness in chest and short winded, coughing up phlegm, wants to know if you can call something in for him like antibiotic or penicillin.  [place]

## 2014-11-05 NOTE — Telephone Encounter (Signed)
Called pt back and states that he will just go to the [facility] in [place] to get checked out, there they can do xrays,etc.

## 2014-11-05 NOTE — Telephone Encounter (Signed)
Pt needs appt can take mucinex or robitussin until then

## 2014-11-10 ENCOUNTER — Telehealth: Payer: Self-pay | Admitting: Family Medicine

## 2014-11-10 MED ORDER — LEVOFLOXACIN 500 MG PO TABS: 500.0000 mg | ORAL_TABLET | Freq: Every day | ORAL | Status: DC

## 2014-11-10 NOTE — Telephone Encounter (Signed)
[phone] Pt is needing a anabiotic  He was giving [facility] and [facility] and its not helping [employer]

## 2014-11-10 NOTE — Telephone Encounter (Signed)
Med sent to pharm and pt aware 

## 2014-11-10 NOTE — Telephone Encounter (Signed)
levaquin 500 qd x 7 days

## 2014-12-09 ENCOUNTER — Ambulatory Visit (INDEPENDENT_AMBULATORY_CARE_PROVIDER_SITE_OTHER): Payer: Self-pay | Admitting: Family Medicine

## 2014-12-09 ENCOUNTER — Encounter: Payer: Self-pay | Admitting: Family Medicine

## 2014-12-09 VITALS — BP 146/80 | HR 82 | Temp 97.9°F | Resp 18 | Ht 72.0 in | Wt 219.0 lb

## 2014-12-09 DIAGNOSIS — J321 Chronic frontal sinusitis: Secondary | ICD-10-CM

## 2014-12-09 MED ORDER — PREDNISONE 20 MG PO TABS: ORAL_TABLET | ORAL | Status: DC

## 2014-12-09 NOTE — Progress Notes (Signed)
   Subjective:    Patient ID: Eddie Wilson, male    DOB: [birth year], 74 y.o.   MRN: [id number]  HPI  Patient has had chronic sinus drainage now for more than 6 weeks. He reports pain and pressure in his frontal sinuses. He has been treated with a Z-Pak as well as Levaquin with no benefit. He reports rhinorrhea, postnasal drip, and a cough productive of mucus and clear sputum. In the past this has responded well to prednisone and he is requesting prednisone. Past Medical History  Diagnosis Date  . Throat cancer   . Hyperlipemia   . Hypertension   . CAD (coronary artery disease)    Past Surgical History  Procedure Laterality Date  . Throat surgery     Current Outpatient Prescriptions on File Prior to Visit  Medication Sig Dispense Refill  . ALPRAZolam (XANAX) 0.5 MG tablet Take 1 tablet (0.5 mg total) by mouth 3 (three) times daily as needed. 90 tablet 2  . aspirin 325 MG buffered tablet Take 325 mg by mouth daily.    Marland Kitchen docusate sodium (COLACE) 100 MG capsule Take 100 mg by mouth daily.    . fluticasone (FLONASE) 50 MCG/ACT nasal spray Place 2 sprays into both nostrils daily. 16 g 2  . hydrochlorothiazide (MICROZIDE) 12.5 MG capsule Take 12.5 mg by mouth daily.    Marland Kitchen lisinopril (PRINIVIL,ZESTRIL) 10 MG tablet Take 1.5 tablets (15 mg total) by mouth daily. 45 tablet 5  . metoprolol tartrate (LOPRESSOR) 25 MG tablet Take 1 tablet (25 mg total) by mouth 2 (two) times daily. 180 tablet 3   No current facility-administered medications on file prior to visit.   No Known Allergies History   Social History  . Marital Status: Single    Spouse Name: N/A  . Number of Children: N/A  . Years of Education: N/A   Occupational History  . Not on file.   Social History Main Topics  . Smoking status: Former Research scientist (life sciences)  . Smokeless tobacco: Not on file  . Alcohol Use: No  . Drug Use: Not on file  . Sexual Activity: Not on file   Other Topics Concern  . Not on file   Social History Narrative      Review of Systems  All other systems reviewed and are negative.      Objective:   Physical Exam  Constitutional: He appears well-developed and well-nourished.  HENT:  Right Ear: External ear normal.  Left Ear: External ear normal.  Nose: Mucosal edema and rhinorrhea present. Right sinus exhibits no maxillary sinus tenderness and no frontal sinus tenderness. Left sinus exhibits no maxillary sinus tenderness and no frontal sinus tenderness.  Mouth/Throat: Oropharynx is clear and moist.  Neck: Neck supple.  Cardiovascular: Normal rate, regular rhythm and normal heart sounds.   Pulmonary/Chest: Effort normal and breath sounds normal. No respiratory distress. He has no wheezes. He has no rales.  Lymphadenopathy:    He has no cervical adenopathy.  Vitals reviewed.         Assessment & Plan:  Chronic frontal sinusitis - Plan: predniSONE (DELTASONE) 20 MG tablet  Patient has a chronic sinusitis. I recommended he try Flonase 2 sprays each nostril twice a day for 1 week. I recommended he do that first due to the risk of prednisone. If his symptoms are not improving after 1 week, I did give the patient a prescription for a prednisone taper pack to last 6 days.

## 2015-01-12 ENCOUNTER — Telehealth: Payer: Self-pay | Admitting: Family Medicine

## 2015-01-12 MED ORDER — HYDROCHLOROTHIAZIDE 12.5 MG PO CAPS: 12.5000 mg | ORAL_CAPSULE | Freq: Every day | ORAL | Status: DC

## 2015-01-12 NOTE — Telephone Encounter (Signed)
Med sent to pharm 

## 2015-01-12 NOTE — Telephone Encounter (Signed)
Eddie Wilson calling to get refill on hydrochlorothiazide If possible  [place] has faxed over and they have heard no response    [phone] [facility]

## 2015-01-19 ENCOUNTER — Telehealth: Payer: Self-pay | Admitting: Family Medicine

## 2015-01-19 MED ORDER — LACTULOSE 10 GM/15ML PO SOLN: 10.0000 g | Freq: Every day | ORAL | Status: DC | PRN

## 2015-01-19 NOTE — Telephone Encounter (Signed)
Med sent to pharm 

## 2015-02-10 ENCOUNTER — Other Ambulatory Visit: Payer: Self-pay | Admitting: Family Medicine

## 2015-02-10 NOTE — Telephone Encounter (Signed)
Pt is needing a refill on simvastatin (ZOCOR) 40 MG tablet [facility]  [phone]

## 2015-02-10 NOTE — Telephone Encounter (Signed)
?   OK to Refill Xanax

## 2015-02-10 NOTE — Telephone Encounter (Signed)
Also needs ALPRAZolam (XANAX) 0.5 MG tablet

## 2015-02-12 ENCOUNTER — Encounter: Payer: Self-pay | Admitting: Family Medicine

## 2015-02-12 ENCOUNTER — Ambulatory Visit (INDEPENDENT_AMBULATORY_CARE_PROVIDER_SITE_OTHER): Payer: Self-pay | Admitting: Family Medicine

## 2015-02-12 VITALS — BP 150/68 | HR 72 | Temp 98.4°F | Resp 18 | Ht 72.0 in | Wt 220.0 lb

## 2015-02-12 DIAGNOSIS — B372 Candidiasis of skin and nail: Secondary | ICD-10-CM

## 2015-02-12 MED ORDER — CLOTRIMAZOLE-BETAMETHASONE 1-0.05 % EX CREA: 1.0000 "application " | TOPICAL_CREAM | Freq: Two times a day (BID) | CUTANEOUS | Status: DC

## 2015-02-12 MED ORDER — ALPRAZOLAM 0.5 MG PO TABS: 0.5000 mg | ORAL_TABLET | Freq: Three times a day (TID) | ORAL | Status: DC | PRN

## 2015-02-12 MED ORDER — SIMVASTATIN 40 MG PO TABS: 40.0000 mg | ORAL_TABLET | Freq: Every day | ORAL | Status: DC

## 2015-02-12 NOTE — Progress Notes (Signed)
   Subjective:    Patient ID: Eddie Wilson, male    DOB: [birth year], 74 y.o.   MRN: [id number]  HPI   patient presents today with a rash in his left groin.   The rash is located in the intertriginous crease between his scrotum and his upper thigh. The rash is a serpiginous well demarcated erythematous patch with fissure in the center. It appears to be Candida intertrigo. He also has a erythematous brown scaly hard papule on his left cheek. It appears to be an actinic keratosis.   However the patient is scheduled to see his dermatologist at the [facility] next week and I have recommended that he showed this to the dermatologist for possible treatment with cryotherapy. Past Medical History  Diagnosis Date  . Throat cancer   . Hyperlipemia   . Hypertension   . CAD (coronary artery disease)    Past Surgical History  Procedure Laterality Date  . Throat surgery     Current Outpatient Prescriptions on File Prior to Visit  Medication Sig Dispense Refill  . ALPRAZolam (XANAX) 0.5 MG tablet Take 1 tablet (0.5 mg total) by mouth 3 (three) times daily as needed. 90 tablet 2  . aspirin 325 MG buffered tablet Take 325 mg by mouth daily.    Marland Kitchen docusate sodium (COLACE) 100 MG capsule Take 100 mg by mouth daily.    . fluticasone (FLONASE) 50 MCG/ACT nasal spray Place 2 sprays into both nostrils daily. 16 g 2  . hydrochlorothiazide (MICROZIDE) 12.5 MG capsule Take 1 capsule (12.5 mg total) by mouth daily. 90 capsule 3  . lactulose (CHRONULAC) 10 GM/15ML solution Take 15 mLs (10 g total) by mouth daily as needed for mild constipation. 1419 mL 3  . lisinopril (PRINIVIL,ZESTRIL) 10 MG tablet Take 1.5 tablets (15 mg total) by mouth daily. 45 tablet 5  . metoprolol tartrate (LOPRESSOR) 25 MG tablet Take 1 tablet (25 mg total) by mouth 2 (two) times daily. 180 tablet 3  . predniSONE (DELTASONE) 20 MG tablet 3 tabs poqday 1-2, 2 tabs poqday 3-4, 1 tab poqday 5-6 12 tablet 0  . simvastatin (ZOCOR) 40 MG tablet Take 1 tablet  (40 mg total) by mouth daily. 30 tablet 5   No current facility-administered medications on file prior to visit.   No Known Allergies History   Social History  . Marital Status: Single    Spouse Name: N/A  . Number of Children: N/A  . Years of Education: N/A   Occupational History  . Not on file.   Social History Main Topics  . Smoking status: Former Research scientist (life sciences)  . Smokeless tobacco: Not on file  . Alcohol Use: No  . Drug Use: Not on file  . Sexual Activity: Not on file   Other Topics Concern  . Not on file   Social History Narrative     Review of Systems  All other systems reviewed and are negative.      Objective:   Physical Exam  Cardiovascular: Normal rate and regular rhythm.   Pulmonary/Chest: Effort normal and breath sounds normal.  Skin: Rash noted. There is erythema.  Vitals reviewed.         Assessment & Plan:  Candidal intertrigo - Plan: clotrimazole-betamethasone (LOTRISONE) cream   Apply Lotrisone cream twice a day for 14 days.

## 2015-02-12 NOTE — Telephone Encounter (Signed)
ok 

## 2015-02-12 NOTE — Telephone Encounter (Signed)
Medication called/sent to requested pharmacy  

## 2015-03-10 ENCOUNTER — Other Ambulatory Visit: Payer: Self-pay | Admitting: Family Medicine

## 2015-03-10 NOTE — Telephone Encounter (Signed)
Medication refilled per protocol. 

## 2015-04-10 ENCOUNTER — Ambulatory Visit: Payer: Self-pay | Admitting: Family Medicine

## 2015-04-17 ENCOUNTER — Telehealth: Payer: Self-pay | Admitting: Family Medicine

## 2015-04-17 ENCOUNTER — Other Ambulatory Visit: Payer: Self-pay | Admitting: Family Medicine

## 2015-04-17 MED ORDER — LISINOPRIL 10 MG PO TABS: ORAL_TABLET | ORAL | Status: DC

## 2015-04-17 NOTE — Telephone Encounter (Signed)
Medication refilled per protocol. 

## 2015-04-17 NOTE — Telephone Encounter (Signed)
Pt was confused about MG's on his lisinopril - informed pt of correct dose and 90 day supply sent to pharm.

## 2015-04-17 NOTE — Telephone Encounter (Signed)
[name] would like to talk to you regarding this patient and his lisinopril Please call her at [phone]

## 2015-05-07 ENCOUNTER — Telehealth: Payer: Self-pay | Admitting: Family Medicine

## 2015-05-07 NOTE — Telephone Encounter (Signed)
Pt has been sick with fever, diarrhea.  Just feels really bad.  Recommended he go to [facility]

## 2015-05-18 ENCOUNTER — Emergency Department (HOSPITAL_COMMUNITY): Payer: No Typology Code available for payment source

## 2015-05-18 ENCOUNTER — Emergency Department (HOSPITAL_COMMUNITY)
Admission: EM | Admit: 2015-05-18 | Discharge: 2015-05-18 | Disposition: A | Discharge status: Home / Self Care | Payer: No Typology Code available for payment source | Attending: Physician Assistant | Admitting: Physician Assistant

## 2015-05-18 ENCOUNTER — Encounter (HOSPITAL_COMMUNITY): Payer: Self-pay

## 2015-05-18 DIAGNOSIS — Z7982 Long term (current) use of aspirin: Secondary | ICD-10-CM | POA: Diagnosis not present

## 2015-05-18 DIAGNOSIS — Z7951 Long term (current) use of inhaled steroids: Secondary | ICD-10-CM | POA: Insufficient documentation

## 2015-05-18 DIAGNOSIS — Y998 Other external cause status: Secondary | ICD-10-CM | POA: Insufficient documentation

## 2015-05-18 DIAGNOSIS — Z85818 Personal history of malignant neoplasm of other sites of lip, oral cavity, and pharynx: Secondary | ICD-10-CM | POA: Insufficient documentation

## 2015-05-18 DIAGNOSIS — Z87891 Personal history of nicotine dependence: Secondary | ICD-10-CM | POA: Diagnosis not present

## 2015-05-18 DIAGNOSIS — Y92410 Unspecified street and highway as the place of occurrence of the external cause: Secondary | ICD-10-CM | POA: Diagnosis not present

## 2015-05-18 DIAGNOSIS — E785 Hyperlipidemia, unspecified: Secondary | ICD-10-CM | POA: Diagnosis not present

## 2015-05-18 DIAGNOSIS — Z79899 Other long term (current) drug therapy: Secondary | ICD-10-CM | POA: Insufficient documentation

## 2015-05-18 DIAGNOSIS — I2510 Atherosclerotic heart disease of native coronary artery without angina pectoris: Secondary | ICD-10-CM | POA: Diagnosis not present

## 2015-05-18 DIAGNOSIS — R062 Wheezing: Secondary | ICD-10-CM | POA: Diagnosis not present

## 2015-05-18 DIAGNOSIS — Y9389 Activity, other specified: Secondary | ICD-10-CM | POA: Diagnosis not present

## 2015-05-18 DIAGNOSIS — S0081XA Abrasion of other part of head, initial encounter: Secondary | ICD-10-CM | POA: Insufficient documentation

## 2015-05-18 DIAGNOSIS — I10 Essential (primary) hypertension: Secondary | ICD-10-CM | POA: Diagnosis not present

## 2015-05-18 DIAGNOSIS — Z041 Encounter for examination and observation following transport accident: Secondary | ICD-10-CM | POA: Diagnosis present

## 2015-05-18 MED ORDER — TETANUS-DIPHTH-ACELL PERTUSSIS 5-2.5-18.5 LF-MCG/0.5 IM SUSP
0.5000 mL | Freq: Once | INTRAMUSCULAR | Status: DC
  Filled 2015-05-18: qty 0.5
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
## 2015-05-22 ENCOUNTER — Other Ambulatory Visit: Payer: Self-pay | Admitting: Family Medicine

## 2015-05-22 MED ORDER — ALPRAZOLAM 0.5 MG PO TABS: 0.5000 mg | ORAL_TABLET | Freq: Three times a day (TID) | ORAL | Status: DC | PRN

## 2015-05-22 NOTE — Telephone Encounter (Signed)
ok 

## 2015-05-22 NOTE — Telephone Encounter (Signed)
LRF 02/12/15 #90 + 2  LOV 02/12/15  OK refill?

## 2015-05-22 NOTE — Telephone Encounter (Signed)
Medication refilled per protocol. 

## 2015-06-11 ENCOUNTER — Other Ambulatory Visit: Payer: Self-pay | Admitting: Family Medicine

## 2015-07-14 ENCOUNTER — Ambulatory Visit: Payer: Self-pay | Admitting: Family Medicine

## 2015-09-14 ENCOUNTER — Telehealth: Payer: Self-pay | Admitting: Family Medicine

## 2015-09-14 NOTE — Telephone Encounter (Signed)
Patients caregiver passed away and usually does this for him  He needs refills on his  Lactulose  Metoprolol   hydrochlorothiazide And lisinopril

## 2015-09-15 MED ORDER — LISINOPRIL 10 MG PO TABS: ORAL_TABLET | ORAL | Status: DC

## 2015-09-15 MED ORDER — METOPROLOL TARTRATE 25 MG PO TABS: 25.0000 mg | ORAL_TABLET | Freq: Two times a day (BID) | ORAL | Status: DC

## 2015-09-15 MED ORDER — HYDROCHLOROTHIAZIDE 12.5 MG PO CAPS: 12.5000 mg | ORAL_CAPSULE | Freq: Every day | ORAL | Status: DC

## 2015-09-15 MED ORDER — LACTULOSE 10 GM/15ML PO SOLN: 10.0000 g | Freq: Every day | ORAL | Status: DC | PRN

## 2015-09-15 NOTE — Telephone Encounter (Signed)
Medication called/sent to requested pharmacy  

## 2015-09-21 ENCOUNTER — Telehealth: Payer: Self-pay | Admitting: Family Medicine

## 2015-09-21 MED ORDER — ALPRAZOLAM 0.5 MG PO TABS: 0.5000 mg | ORAL_TABLET | Freq: Three times a day (TID) | ORAL | Status: DC | PRN

## 2015-09-21 NOTE — Telephone Encounter (Signed)
ok 

## 2015-09-21 NOTE — Telephone Encounter (Signed)
Medication called/sent to requested pharmacy  

## 2015-09-21 NOTE — Telephone Encounter (Signed)
Requesting refill on Xanax - ? OK to Refill  

## 2015-12-24 ENCOUNTER — Encounter: Payer: Self-pay | Admitting: Family Medicine

## 2015-12-24 ENCOUNTER — Ambulatory Visit (INDEPENDENT_AMBULATORY_CARE_PROVIDER_SITE_OTHER): Payer: Self-pay | Admitting: Family Medicine

## 2015-12-24 VITALS — BP 130/70 | HR 78 | Temp 98.1°F | Resp 18 | Wt 205.0 lb

## 2015-12-24 DIAGNOSIS — E785 Hyperlipidemia, unspecified: Secondary | ICD-10-CM

## 2015-12-24 DIAGNOSIS — I10 Essential (primary) hypertension: Secondary | ICD-10-CM

## 2015-12-24 DIAGNOSIS — Z8679 Personal history of other diseases of the circulatory system: Secondary | ICD-10-CM

## 2015-12-24 MED ORDER — FLUTICASONE PROPIONATE 50 MCG/ACT NA SUSP: 2.0000 | Freq: Every day | NASAL | Status: DC

## 2015-12-24 MED ORDER — METOPROLOL TARTRATE 25 MG PO TABS: 25.0000 mg | ORAL_TABLET | Freq: Two times a day (BID) | ORAL | Status: DC
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
## 2015-12-30 ENCOUNTER — Telehealth: Payer: Self-pay | Admitting: Family Medicine

## 2015-12-30 NOTE — Telephone Encounter (Signed)
Labs were reviewed and Dr. Dennard Schaumann stated that he need to have an HGA1C d/t his bs being elevated at 124   Pt is aware of recommendation for a1c and states that his BS is usually around 112-114 and he checks it almost every morning and the [place] told him that as long as it was below 130 he was ok. I did suggest even though that was the case it would prob benefit him to come in and have it checked. He states that maybe at his next ov he will have checked as it seems to be fine in the morning when he checks it.

## 2015-12-31 ENCOUNTER — Encounter: Payer: Self-pay | Admitting: Family Medicine

## 2016-02-09 ENCOUNTER — Telehealth: Payer: Self-pay | Admitting: Family Medicine

## 2016-02-09 NOTE — Telephone Encounter (Signed)
ok 

## 2016-02-09 NOTE — Telephone Encounter (Signed)
Requesting refill on Xanax - LOV 12/24/15 & LRF 09/21/15 #90/2 - ? OK to Refill

## 2016-02-10 MED ORDER — ALPRAZOLAM 0.5 MG PO TABS: 0.5000 mg | ORAL_TABLET | Freq: Three times a day (TID) | ORAL | Status: DC | PRN

## 2016-02-10 NOTE — Telephone Encounter (Signed)
Medication called/sent to requested pharmacy  

## 2016-02-12 ENCOUNTER — Telehealth: Payer: Self-pay | Admitting: Family Medicine

## 2016-02-12 ENCOUNTER — Other Ambulatory Visit: Payer: Self-pay | Admitting: Family Medicine

## 2016-02-12 MED ORDER — HYDROCHLOROTHIAZIDE 25 MG PO TABS: 12.5000 mg | ORAL_TABLET | Freq: Every day | ORAL | Status: DC

## 2016-02-12 MED ORDER — HYDROCHLOROTHIAZIDE 12.5 MG PO CAPS: 12.5000 mg | ORAL_CAPSULE | Freq: Every day | ORAL | Status: DC

## 2016-02-12 NOTE — Telephone Encounter (Signed)
hydrochlorothiazide (MICROZIDE) 12.5 MG  [facility] requesting refill

## 2016-02-12 NOTE — Telephone Encounter (Signed)
Medication called/sent to requested pharmacy  

## 2016-03-23 ENCOUNTER — Other Ambulatory Visit: Payer: Self-pay | Admitting: Family Medicine

## 2016-03-23 NOTE — Telephone Encounter (Signed)
Refill appropriate and filled per protocol. 

## 2016-05-23 ENCOUNTER — Other Ambulatory Visit: Payer: Self-pay | Admitting: Family Medicine

## 2016-05-23 NOTE — Telephone Encounter (Signed)
Refill appropriate and filled per protocol. 

## 2016-05-25 ENCOUNTER — Telehealth: Payer: Self-pay | Admitting: Family Medicine

## 2016-05-25 NOTE — Telephone Encounter (Signed)
Requesting a refill on Xanax - Ok to refill??       

## 2016-05-26 MED ORDER — ALPRAZOLAM 0.5 MG PO TABS: 0.5000 mg | ORAL_TABLET | Freq: Three times a day (TID) | ORAL | 2 refills | Status: DC | PRN

## 2016-05-26 NOTE — Telephone Encounter (Signed)
Medication called/sent to requested pharmacy  

## 2016-05-26 NOTE — Telephone Encounter (Signed)
ok 

## 2016-06-09 ENCOUNTER — Ambulatory Visit (INDEPENDENT_AMBULATORY_CARE_PROVIDER_SITE_OTHER): Payer: Self-pay | Admitting: Family Medicine

## 2016-06-09 ENCOUNTER — Encounter: Payer: Self-pay | Admitting: Family Medicine

## 2016-06-09 VITALS — BP 120/60 | HR 66 | Temp 98.0°F | Resp 16 | Ht 72.0 in | Wt 202.0 lb

## 2016-06-09 DIAGNOSIS — I10 Essential (primary) hypertension: Secondary | ICD-10-CM

## 2016-06-09 NOTE — Progress Notes (Signed)
Subjective:    Patient ID: Eddie Wilson, male    DOB: 14-Jan-1941, 75 y.o.   MRN: [id number]  HPI Patient's blood pressure has consistently been running high at home as of late. His blood pressure has been as high as 99991111 systolic over [id number] diastolic. Here today I checked his blood pressure myself and found to be 160 on the left systolic and 0000000 on the right systolic. His diastolic blood pressure was excellent at 74 both sides. He denies any chest pain shortness of breath or dyspnea on exertion. He is taking metoprolol lisinopril and hydrochlorothiazide for his blood pressure. Past Medical History:  Diagnosis Date  . CAD (coronary artery disease)   . Hyperlipemia   . Hypertension   . Prediabetes   . Throat cancer [facility])    Past Surgical History:  Procedure Laterality Date  . THROAT SURGERY     Current Outpatient Prescriptions on File Prior to Visit  Medication Sig Dispense Refill  . ALPRAZolam (XANAX) 0.5 MG tablet Take 1 tablet (0.5 mg total) by mouth 3 (three) times daily as needed. 90 tablet 2  . aspirin 325 MG buffered tablet Take 325 mg by mouth daily.    . clotrimazole-betamethasone (LOTRISONE) cream Apply 1 application topically 2 (two) times daily. (Patient taking differently: Apply 1 application topically 2 (two) times daily as needed (skin irrtation). ) 30 g 0  . fluticasone (FLONASE) 50 MCG/ACT nasal spray Place 2 sprays into both nostrils daily. (Patient taking differently: Place 2 sprays into both nostrils daily as needed for allergies. ) 16 g 2  . fluticasone (FLONASE) 50 MCG/ACT nasal spray Place 2 sprays into both nostrils daily. 16 g 6  . hydrochlorothiazide (HYDRODIURIL) 25 MG tablet Take 0.5 tablets (12.5 mg total) by mouth daily. 60 tablet 3  . lactulose (CHRONULAC) 10 GM/15ML solution Take 15 mLs (10 g total) by mouth daily as needed for mild constipation. 1419 mL 3  . lisinopril (PRINIVIL,ZESTRIL) 10 MG tablet TAKE 1.5 TABLETS (15 MG TOTAL) BY MOUTH DAILY. 135 tablet 3  .  metoprolol tartrate (LOPRESSOR) 25 MG tablet TAKE 1 TABLET BY MOUTH TWICE DAILY 180 tablet 1  . simvastatin (ZOCOR) 40 MG tablet TAKE 1 TABLET (40 MG TOTAL) BY MOUTH DAILY. 30 tablet 5   No current facility-administered medications on file prior to visit.    No Known Allergies Social History   Social History  . Marital status: Single    Spouse name: N/A  . Number of children: N/A  . Years of education: N/A   Occupational History  . Not on file.   Social History Main Topics  . Smoking status: Former Research scientist (life sciences)  . Smokeless tobacco: Not on file  . Alcohol use No  . Drug use: Unknown  . Sexual activity: Not on file   Other Topics Concern  . Not on file   Social History Narrative  . No narrative on file      Review of Systems  All other systems reviewed and are negative.      Objective:   Physical Exam  Cardiovascular: Normal rate, regular rhythm and normal heart sounds.   No murmur heard. Pulmonary/Chest: Effort normal and breath sounds normal. No respiratory distress. He has no wheezes. He has no rales.  Abdominal: Soft. Bowel sounds are normal. He exhibits no distension. There is no tenderness. There is no rebound.  Musculoskeletal: He exhibits no edema.  Vitals reviewed.         Assessment & Plan:  Essential  hypertension  Decreased sodium consumption and increase lisinopril to 20 mg a day. Recheck blood pressure in 2 weeks and titrate lisinopril and hydrochlorothiazide as necessary to achieve blood pressures less than 140/90 if necessary.

## 2016-09-28 ENCOUNTER — Telehealth: Payer: Self-pay | Admitting: Family Medicine

## 2016-09-28 NOTE — Telephone Encounter (Signed)
Requesting a refill on Xanax - Ok to refill??       

## 2016-09-29 MED ORDER — ALPRAZOLAM 0.5 MG PO TABS: 0.5000 mg | ORAL_TABLET | Freq: Three times a day (TID) | ORAL | 2 refills | Status: DC | PRN

## 2016-09-29 NOTE — Telephone Encounter (Signed)
ok 

## 2016-09-29 NOTE — Telephone Encounter (Signed)
Medication called/sent to requested pharmacy  

## 2016-10-01 ENCOUNTER — Other Ambulatory Visit: Payer: Self-pay | Admitting: Family Medicine

## 2016-11-03 ENCOUNTER — Other Ambulatory Visit: Payer: Self-pay | Admitting: Family Medicine

## 2016-11-29 ENCOUNTER — Encounter: Payer: Self-pay | Admitting: Family Medicine

## 2016-11-29 ENCOUNTER — Ambulatory Visit (INDEPENDENT_AMBULATORY_CARE_PROVIDER_SITE_OTHER): Payer: Self-pay | Admitting: Family Medicine

## 2016-11-29 VITALS — BP 100/58 | HR 68 | Temp 98.1°F | Resp 18 | Ht 72.0 in | Wt 199.0 lb

## 2016-11-29 DIAGNOSIS — M5440 Lumbago with sciatica, unspecified side: Secondary | ICD-10-CM

## 2016-11-29 MED ORDER — PREDNISONE 20 MG PO TABS: ORAL_TABLET | ORAL | 0 refills | Status: DC

## 2016-11-29 NOTE — Progress Notes (Signed)
   Subjective:    Patient ID: Eddie Wilson, male    DOB: 08-29-1941, 76 y.o.   MRN: [id number]  HPI Over the last week, the patient has been gradually spearing single worsening low back pain. It is now constant located around the level of L5. It is  located on both sides of the spine. In addition to the pain, he is experiencing burning tingling paresthesias radiating down both legs left greater than right. It is worse with prolonged standing. It is also worse riding in a truck. He denies any bowel or bladder incontinence. He denies any saddle anesthesia. He denies any leg weakness. Past Medical History:  Diagnosis Date  . CAD (coronary artery disease)   . Hyperlipemia   . Hypertension   . Prediabetes   . Throat cancer [facility])    Past Surgical History:  Procedure Laterality Date  . THROAT SURGERY     Current Outpatient Prescriptions on File Prior to Visit  Medication Sig Dispense Refill  . aspirin 325 MG buffered tablet Take 325 mg by mouth daily.    . hydrochlorothiazide (HYDRODIURIL) 25 MG tablet Take 0.5 tablets (12.5 mg total) by mouth daily. 60 tablet 3  . lisinopril (PRINIVIL,ZESTRIL) 10 MG tablet TAKE 1.5 TABLETS (15 MG TOTAL) BY MOUTH DAILY. 135 tablet 3  . metoprolol tartrate (LOPRESSOR) 25 MG tablet TAKE 1 TABLET BY MOUTH TWICE DAILY 180 tablet 1  . simvastatin (ZOCOR) 40 MG tablet TAKE 1 TABLET (40 MG TOTAL) BY MOUTH DAILY. 30 tablet 5   No current facility-administered medications on file prior to visit.    No Known Allergies Social History   Social History  . Marital status: Single    Spouse name: N/A  . Number of children: N/A  . Years of education: N/A   Occupational History  . Not on file.   Social History Main Topics  . Smoking status: Former Research scientist (life sciences)  . Smokeless tobacco: Never Used  . Alcohol use No  . Drug use: Unknown  . Sexual activity: Not on file   Other Topics Concern  . Not on file   Social History Narrative  . No narrative on file      Review  of Systems  All other systems reviewed and are negative.      Objective:   Physical Exam  Cardiovascular: Normal rate, regular rhythm and normal heart sounds.   Pulmonary/Chest: Effort normal and breath sounds normal. No respiratory distress. He has no wheezes. He has no rales.  Musculoskeletal:       Lumbar back: He exhibits decreased range of motion and pain. He exhibits no deformity.  Vitals reviewed.         Assessment & Plan:  Acute bilateral low back pain with sciatica, sciatica laterality unspecified - Plan: predniSONE (DELTASONE) 20 MG tablet  I suspect the patient has a herniated disc versus spinal stenosis. Begin with a prednisone taper pack. If symptoms are not improving after 1 week, proceed with imaging of the lumbar spine.

## 2016-12-09 ENCOUNTER — Other Ambulatory Visit: Payer: Self-pay | Admitting: Family Medicine

## 2017-02-13 ENCOUNTER — Encounter: Payer: Self-pay | Admitting: Family Medicine

## 2017-02-13 ENCOUNTER — Ambulatory Visit (INDEPENDENT_AMBULATORY_CARE_PROVIDER_SITE_OTHER): Payer: Medicare Other | Admitting: Family Medicine

## 2017-02-13 VITALS — BP 122/70 | HR 76 | Temp 98.7°F | Resp 16 | Ht 72.0 in | Wt 201.0 lb

## 2017-02-13 DIAGNOSIS — I714 Abdominal aortic aneurysm, without rupture, unspecified: Secondary | ICD-10-CM

## 2017-02-13 NOTE — Progress Notes (Signed)
   Subjective:    Patient ID: Eddie Wilson, male    DOB: [birth year], 76 y.o.   MRN: [id number]  HPI  Patient receives the majority of his care at the [place]. He has a history of a AAA although he does not remember the diameter. He states that he has not had any type of screening ultrasound or CT scan in more than a year.Marland Kitchen He denies any abdominal pain. He denies any low back pain. He is concerned that his AAA has not been checked. Past Medical History:  Diagnosis Date  . CAD (coronary artery disease)   . Hyperlipemia   . Hypertension   . Prediabetes   . 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 THROAT SURGERY     Current Outpatient Prescriptions on File Prior to Visit  Medication Sig Dispense Refill  . aspirin 325 MG buffered tablet Take 325 mg by mouth daily.    . hydrochlorothiazide (HYDRODIURIL) 25 MG tablet Take 0.5 tablets (12.5 mg total) by mouth daily. 60 tablet 3  . lisinopril (PRINIVIL,ZESTRIL) 10 MG tablet TAKE 1.5 TABLETS (15 MG TOTAL) BY MOUTH DAILY. 135 tablet 2  . metoprolol tartrate (LOPRESSOR) 25 MG tablet TAKE 1 TABLET BY MOUTH TWICE DAILY 180 tablet 1  . predniSONE (DELTASONE) 20 MG tablet 3 tabs poqday 1-2, 2 tabs poqday 3-4, 1 tab poqday 5-6 12 tablet 0  . simvastatin (ZOCOR) 40 MG tablet TAKE 1 TABLET (40 MG TOTAL) BY MOUTH DAILY. 30 tablet 5   No current facility-administered medications on file prior to visit.    No Known Allergies Social History   Social History  . Marital status: Single    Spouse name: N/A  . Number of children: N/A  . Years of education: N/A   Occupational History  . Not on file.   Social History Main Topics  . Smoking status: Former Research scientist (life sciences)  . Smokeless tobacco: Never Used  . Alcohol use No  . Drug use: Unknown  . Sexual activity: Not on file   Other Topics Concern  . Not on file   Social History Narrative  . No narrative on file     Review of Systems  All other systems reviewed and are  negative.      Objective:   Physical Exam  Constitutional: He appears well-developed and well-nourished.  Cardiovascular: Normal rate, regular rhythm and normal heart sounds.   No murmur heard. Pulmonary/Chest: Effort normal and breath sounds normal. No respiratory distress. He has no wheezes. He has no rales.  Abdominal: Soft. Bowel sounds are normal.  Musculoskeletal: He exhibits no edema.  Vitals reviewed.         Assessment & Plan:  Abdominal aortic aneurysm (AAA) without rupture ([place])  I have no records from the [place]. I have no record of him having a AAA. Therefore I recommended that he contact his primary care provider at the [facility] and ask if they will schedule a screening ultrasound of aorta to evaluate his AAA. I will be glad to schedule this for the patient if they are unable to. However he should inquire with the [place] first for his own cost.

## 2017-02-27 ENCOUNTER — Other Ambulatory Visit: Payer: Self-pay | Admitting: Family Medicine

## 2017-02-28 NOTE — Telephone Encounter (Signed)
ok 

## 2017-02-28 NOTE — Telephone Encounter (Signed)
Ok to refill 

## 2017-02-28 NOTE — Telephone Encounter (Signed)
Medication called to pharmacy. 

## 2017-05-11 ENCOUNTER — Other Ambulatory Visit: Payer: Self-pay | Admitting: Family Medicine

## 2017-05-12 ENCOUNTER — Encounter: Payer: Self-pay | Admitting: Family Medicine

## 2017-05-12 ENCOUNTER — Ambulatory Visit (INDEPENDENT_AMBULATORY_CARE_PROVIDER_SITE_OTHER): Payer: Self-pay | Admitting: Family Medicine

## 2017-05-12 VITALS — BP 130/70 | HR 72 | Temp 97.7°F | Resp 16 | Ht 72.0 in | Wt 199.0 lb

## 2017-05-12 DIAGNOSIS — D485 Neoplasm of uncertain behavior of skin: Secondary | ICD-10-CM

## 2017-05-12 NOTE — Progress Notes (Signed)
   Subjective:    Patient ID: Eddie Wilson, male    DOB: December 25, 1940, 76 y.o.   MRN: [id number]  HPI Patient made an appointment for rash on his right shin. However the rash is completely subsided and there is no visible rash today for me to see. Therefore I'm not sure what the rash was initially but it is gone away. However he does have a lesion on his nose which is been there for several months. Is approximately 5 mm in diameter. It is a bright pink papule with telangiectasias and a small central ulcer concerning for possible basal cell cancer. I recommended treatment. Options include cryotherapy versus excisional biopsy. Given its location, the patient would like to try cryotherapy first and then an excisional biopsy if it persists. Past Medical History:  Diagnosis Date  . CAD (coronary artery disease)   . Hyperlipemia   . Hypertension   . Prediabetes   . 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 THROAT SURGERY     Current Outpatient Prescriptions on File Prior to Visit  Medication Sig Dispense Refill  . ALPRAZolam (XANAX) 0.5 MG tablet TAKE 1 TABLET BY MOUTH 3 TIMES A DAY AS NEEDED 90 tablet 2  . aspirin 325 MG buffered tablet Take 325 mg by mouth daily.    . hydrochlorothiazide (HYDRODIURIL) 25 MG tablet TAKE 1/2 TABLETS BY MOUTH ONCE A DAY 60 tablet 2  . lisinopril (PRINIVIL,ZESTRIL) 10 MG tablet TAKE 1.5 TABLETS (15 MG TOTAL) BY MOUTH DAILY. 135 tablet 2  . metoprolol tartrate (LOPRESSOR) 25 MG tablet TAKE 1 TABLET BY MOUTH TWICE DAILY 180 tablet 2  . simvastatin (ZOCOR) 40 MG tablet TAKE 1 TABLET (40 MG TOTAL) BY MOUTH DAILY. 30 tablet 5   No current facility-administered medications on file prior to visit.    No Known Allergies Social History   Social History  . Marital status: Single    Spouse name: N/A  . Number of children: N/A  . Years of education: N/A   Occupational History  . Not on file.   Social History Main Topics  . Smoking  status: Former Research scientist (life sciences)  . Smokeless tobacco: Never Used  . Alcohol use No  . Drug use: Unknown  . Sexual activity: Not on file   Other Topics Concern  . Not on file   Social History Narrative  . No narrative on file      Review of Systems  All other systems reviewed and are negative.      Objective:   Physical Exam  HENT:  Head:    Cardiovascular: Normal rate, regular rhythm and normal heart sounds.   Pulmonary/Chest: Effort normal and breath sounds normal. No respiratory distress. He has no wheezes. He has no rales.  Vitals reviewed.  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 Patient tolerated the procedure well without complication. Reassess in 4 weeks. If persistent, needs excisional biopsy. Patient would like me to do the excisional biopsy if possible. I explained to him that that would increase his risk of scarring. He states that he is not concerned about scarring. Reassess if persistent in 4 weeks

## 2017-06-01 ENCOUNTER — Encounter: Payer: Self-pay | Admitting: Family Medicine

## 2017-06-15 ENCOUNTER — Other Ambulatory Visit: Payer: Self-pay | Admitting: Family Medicine

## 2017-06-15 NOTE — Telephone Encounter (Signed)
Ok to refill 

## 2017-06-15 NOTE — Telephone Encounter (Signed)
ok 

## 2017-08-11 ENCOUNTER — Other Ambulatory Visit: Payer: Self-pay | Admitting: Family Medicine

## 2017-08-11 NOTE — Telephone Encounter (Signed)
ok 

## 2017-08-11 NOTE — Telephone Encounter (Signed)
Ok to refill 

## 2017-08-15 ENCOUNTER — Ambulatory Visit: Payer: Medicare Other | Admitting: Family Medicine

## 2017-10-07 ENCOUNTER — Other Ambulatory Visit: Payer: Self-pay | Admitting: Family Medicine

## 2017-10-09 NOTE — Telephone Encounter (Signed)
ok 

## 2017-10-09 NOTE — Telephone Encounter (Signed)
Ok to refill??      Xanax

## 2017-10-25 ENCOUNTER — Telehealth: Payer: Self-pay | Admitting: Family Medicine

## 2017-10-25 NOTE — Telephone Encounter (Signed)
Patient is calling to let you know that the growth on his nose is not healing  Would like a call back at [phone]

## 2017-10-25 NOTE — Telephone Encounter (Signed)
LOV states he needs excisional bx - will you do this or does he need mohs?

## 2017-10-26 NOTE — Telephone Encounter (Signed)
I can't remember exactly how big or where it was, but if it is on his nose, I would recommend dermatology

## 2017-10-27 NOTE — Telephone Encounter (Signed)
Pt aware of recommendations and will get this checked at the [place]. He will call me back and let me know if there is anything we can do to help.

## 2017-11-06 ENCOUNTER — Ambulatory Visit: Payer: Self-pay | Admitting: Family Medicine

## 2017-11-14 ENCOUNTER — Ambulatory Visit: Payer: Self-pay | Admitting: Family Medicine

## 2017-11-14 ENCOUNTER — Other Ambulatory Visit: Payer: Self-pay

## 2017-11-14 ENCOUNTER — Encounter: Payer: Self-pay | Admitting: Family Medicine

## 2017-11-14 VITALS — BP 128/78 | HR 70 | Temp 97.9°F | Resp 18 | Ht 72.0 in | Wt 198.0 lb

## 2017-11-14 DIAGNOSIS — L989 Disorder of the skin and subcutaneous tissue, unspecified: Secondary | ICD-10-CM

## 2017-11-14 NOTE — Progress Notes (Signed)
   Subjective:    Patient ID: Eddie Wilson, male    DOB: [birth year], 77 y.o.   MRN: [id number]  Patient presents for Neoplasm on Nose (haas been seen by [facility]- last notes recommended excisional biopsy, but pt was supposed to go to [place] to have it looked at- area has healed, but would like MD to assess)   Spot on nose, back in July he had cryotherapy. He states it didn't heal for a while. He went to [place] 2 weeks ago, they advised it looked okay and did not need to be biopsied. He realized he was washing his face to harsh and pulling scab off causing it not to heal. Now that he stopped doing that nose has healed up and no longer scaley, or painful. He just wanted [place] to check it here in the office He has no concerns otherwise today   He is taking bP meds as prescribed His labs are done at the [place] every 6 months, last done 2 months ago, where he say his primary there.  Review Of Systems:  GEN- denies fatigue, fever, weight loss,weakness, recent illness HEENT- denies eye drainage, change in vision, nasal discharge, CVS- denies chest pain, palpitations RESP- denies SOB, cough, wheeze ABD- denies N/V, change in stools, abd pain GU- denies dysuria, hematuria, dribbling, incontinence MSK- denies joint pain, muscle aches, injury Neuro- denies headache, dizziness, syncope, seizure activity       Objective:    BP 128/78   Pulse 70   Temp 97.9 F (36.6 C) (Oral)   Resp 18   Ht 6' (1.829 m)   Wt 198 lb (89.8 kg)   SpO2 97%   BMI 26.85 kg/m  GEN- NAD, alert and oriented x3 HEENT- PERRL, EOMI, non injected sclera, pink conjunctiva, MMM, oropharynx clear  Nose- skin in tact, no scaley or erythematous lesion in area of concern, NT Neck- Trach with valve        Assessment & Plan:      Problem List Items Addressed This Visit    None    Visit Diagnoses    External nasal lesion    -  Primary   Now healed, possibly an Actinic Keratosis and he was prohibiting healing by scrubbing., At this time  dermatology or biopsy not needed. If it recoccurs can send. He is in agreement with this plan      Note: This dictation was prepared with Dragon dictation along with smaller phrase technology. Any transcriptional errors that result from this process are unintentional.

## 2017-11-14 NOTE — Patient Instructions (Addendum)
F/U schedule Dr. Dennard Schaumann in 6 months

## 2017-11-20 ENCOUNTER — Ambulatory Visit: Payer: Self-pay | Admitting: Family Medicine

## 2017-12-15 ENCOUNTER — Other Ambulatory Visit: Payer: Self-pay | Admitting: Family Medicine

## 2017-12-15 NOTE — Telephone Encounter (Signed)
Ok to refill??  Last office visit 05/12/2017.  Last refill 10/09/2017, #1 refill.

## 2018-01-13 ENCOUNTER — Other Ambulatory Visit: Payer: Self-pay | Admitting: Family Medicine

## 2018-01-15 NOTE — Telephone Encounter (Signed)
Pt is requesting refill on Xanax   LOV: 05/12/17  LRF:   12/15/17

## 2018-02-13 ENCOUNTER — Other Ambulatory Visit: Payer: Self-pay | Admitting: Family Medicine

## 2018-02-13 MED ORDER — ALPRAZOLAM 0.5 MG PO TABS: 0.5000 mg | ORAL_TABLET | Freq: Three times a day (TID) | ORAL | 0 refills | Status: DC | PRN

## 2018-02-13 NOTE — Telephone Encounter (Signed)
Refill on xanax to [facility]

## 2018-02-13 NOTE — Telephone Encounter (Signed)
Pt is requesting refill on Xanax   LOV: 05/12/17  LRF:   01/15/18

## 2018-02-16 ENCOUNTER — Other Ambulatory Visit: Payer: Self-pay | Admitting: Family Medicine

## 2018-03-14 ENCOUNTER — Other Ambulatory Visit: Payer: Self-pay | Admitting: Family Medicine

## 2018-03-14 NOTE — Telephone Encounter (Signed)
Ok to refill??  Last office visit 11/14/2017.  Last refill 02/13/2018.

## 2018-04-17 ENCOUNTER — Other Ambulatory Visit: Payer: Self-pay | Admitting: Family Medicine

## 2018-04-18 NOTE — Telephone Encounter (Signed)
Pt is requesting refill on Xanax   LOV: 05/12/17  LRF:   03/15/18

## 2018-05-14 ENCOUNTER — Encounter: Payer: Self-pay | Admitting: Family Medicine

## 2018-05-14 ENCOUNTER — Ambulatory Visit (INDEPENDENT_AMBULATORY_CARE_PROVIDER_SITE_OTHER): Payer: Medicare Other | Admitting: Family Medicine

## 2018-05-14 VITALS — BP 126/60 | HR 78 | Temp 97.8°F | Resp 16 | Ht 72.0 in | Wt 193.0 lb

## 2018-05-14 DIAGNOSIS — I2510 Atherosclerotic heart disease of native coronary artery without angina pectoris: Secondary | ICD-10-CM

## 2018-05-14 DIAGNOSIS — Z125 Encounter for screening for malignant neoplasm of prostate: Secondary | ICD-10-CM

## 2018-05-14 DIAGNOSIS — I714 Abdominal aortic aneurysm, without rupture, unspecified: Secondary | ICD-10-CM

## 2018-05-14 DIAGNOSIS — E7800 Pure hypercholesterolemia, unspecified: Secondary | ICD-10-CM

## 2018-05-14 DIAGNOSIS — I10 Essential (primary) hypertension: Secondary | ICD-10-CM

## 2018-05-14 NOTE — Progress Notes (Signed)
Subjective:    Patient ID: Eddie Wilson, male    DOB: Jun 11, 1941, 77 y.o.   MRN: [id number]  HPI 01/2017 Patient receives the majority of his care at the [place]. He has a history of a AAA although he does not remember the diameter. He states that he has not had any type of screening ultrasound or CT scan in more than a year.Marland Kitchen He denies any abdominal pain. He denies any low back pain. He is concerned that his AAA has not been checked.  AT that time,my plan was: I have no records from the [place]. I have no record of him having a AAA. Therefore I recommended that he contact his primary care provider at the [facility] and ask if they will schedule a screening ultrasound of aorta to evaluate his AAA. I will be glad to schedule this for the patient if they are unable to. However he should inquire with the [place] first for his own cost.  05/14/18 As stated above, the patient gets the vast majority of his care at the [place].  He has a complete physical exam every year at the [place].  He believes he has had Pneumovax 23 and Prevnar 13.  He states that he will check his records and verify this.  He has not had the shingles vaccine, Shingrix, and I recommended that he check with his primary care provider at the [facility] and see if this is covered for him there.  He has no insurance and he prefers not to get this locally.  He has seen a vascular surgeon.  He states that based on the location near the renal artery, that if he were to have the surgery and survive, he would likely require lifelong dialysis.  Therefore the patient declines surgical correction of his AAA and has elected not to screen at any further.  He does have a lesion on the anterior surface of the nasal bridge.  Is approximately 5 to 6 mm in diameter.  It is an ulcer with raised rolled edges concerning for basal cell carcinoma versus squamous cell carcinoma.  I have recommended that he see his primary care provider at the [facility] to arrange dermatology consult for biopsy.  His blood pressure  today is well controlled at 126/60.  He states that the [place] is performing all of his lab work and that he is recently had a CBC, CMP, lipid panel, and PSA.  He states that he will bring me a copy of this lab work for me to review.  Primarily all we are doing for this patient is seeing him for sick visits and refilling his Xanax if he needs it.  He uses one Xanax tablet every night to help him sleep.  Otherwise he has no concerns  Past Medical History:  Diagnosis Date  . CAD (coronary artery disease)   . Hyperlipemia   . Hypertension   . Prediabetes   . 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 THROAT SURGERY     Current Outpatient Medications on File Prior to Visit  Medication Sig Dispense Refill  . ALPRAZolam (XANAX) 0.5 MG tablet TAKE ONE TABLET BY MOUTH THREE TIMES DAILY AS NEEDED  90 tablet 0  . aspirin 325 MG buffered tablet Take 325 mg by mouth daily.    . hydrochlorothiazide (HYDRODIURIL) 25 MG tablet take 1/2 tablet by mouth once a day 60 tablet 1  . lisinopril (PRINIVIL,ZESTRIL) 10 MG tablet TAKE 1.5 TABLETS  BY MOUTH DAILY 135 tablet 1  . metoprolol tartrate (LOPRESSOR) 25 MG tablet take 1 tablet by mouth twice daily 180 tablet 1  . simvastatin (ZOCOR) 40 MG tablet TAKE 1 TABLET (40 MG TOTAL) BY MOUTH DAILY. 30 tablet 4   No current facility-administered medications on file prior to visit.    No Known Allergies Social History   Socioeconomic History  . Marital status: Single    Spouse name: Not on file  . Number of children: Not on file  . Years of education: Not on file  . Highest education level: Not on file  Occupational History  . Not on file  Social Needs  . Financial resource strain: Not on file  . Food insecurity:    Worry: Not on file    Inability: Not on file  . Transportation needs:    Medical: Not on file    Non-medical: Not on file  Tobacco Use  . Smoking status: Former Research scientist (life sciences)  . Smokeless tobacco: Never Used    Substance and Sexual Activity  . Alcohol use: No  . Drug use: Not on file  . Sexual activity: Not on file  Lifestyle  . Physical activity:    Days per week: Not on file    Minutes per session: Not on file  . Stress: Not on file  Relationships  . Social connections:    Talks on phone: Not on file    Gets together: Not on file    Attends religious service: Not on file    Active member of club or organization: Not on file    Attends meetings of clubs or organizations: Not on file    Relationship status: Not on file  . Intimate partner violence:    Fear of current or ex partner: Not on file    Emotionally abused: Not on file    Physically abused: Not on file    Forced sexual activity: Not on file  Other Topics Concern  . Not on file  Social History Narrative  . Not on file     Review of Systems  All other systems reviewed and are negative.      Objective:   Physical Exam  Constitutional: He appears well-developed and well-nourished.  Cardiovascular: Normal rate, regular rhythm and normal heart sounds.  No murmur heard. Pulmonary/Chest: Effort normal and breath sounds normal. No respiratory distress. He has no wheezes. He has no rales.  Abdominal: Soft. Bowel sounds are normal.  Musculoskeletal: He exhibits no edema.  Vitals reviewed.         Assessment & Plan:  Abdominal aortic aneurysm (AAA) without rupture ([facility])  Essential hypertension  Coronary artery disease involving native heart without angina pectoris, unspecified vessel or lesion type - Plan: CBC with Differential/Platelet, COMPLETE METABOLIC PANEL WITH GFR, Lipid panel  Pure hypercholesterolemia - Plan: CBC with Differential/Platelet, COMPLETE METABOLIC PANEL WITH GFR, Lipid panel  Prostate cancer screening - Plan: PSA  I would like to obtain baseline lab work however the patient has already had this done at the [place].  He will bring me a copy so that I can review his CBC, CMP, lipid panel, and PSA.  I  discussed recommended vaccinations including Shingrix, Prevnar 13, Pneumovax 23.  Cancer screening is up-to-date.  He declines further screening for his AAA.  I have recommended dermatology consult for the suspicious lesion on his nasal bridge which appears to be a basal cell carcinoma versus a squamous cell carcinoma.

## 2018-05-15 ENCOUNTER — Other Ambulatory Visit: Payer: Self-pay | Admitting: Physician Assistant

## 2018-05-15 NOTE — Telephone Encounter (Addendum)
Last OV 7/22 Last refill 04/18/2018 Ok to refill?

## 2018-06-15 ENCOUNTER — Other Ambulatory Visit: Payer: Self-pay | Admitting: Family Medicine

## 2018-06-15 NOTE — Telephone Encounter (Signed)
Pt is requesting refill on Xanax   LOV: 05/14/18 LRF:  05/15/18

## 2018-07-03 ENCOUNTER — Telehealth: Payer: Self-pay | Admitting: Family Medicine

## 2018-07-03 NOTE — Telephone Encounter (Signed)
Disability form dropped off. Placed into yellow folder.

## 2018-07-05 NOTE — Telephone Encounter (Addendum)
Note to Dr. Dennard Schaumann that pt wants [place] to put on paper just the stuff we treat him for IE his skin cancer on nose and his knee were the 2 things he mentioned.  Paperwork placed in Dr. Samella Parr blue folder.

## 2018-07-11 NOTE — Telephone Encounter (Signed)
Paperwork completed and pt aware to pick up via vm

## 2018-07-17 ENCOUNTER — Other Ambulatory Visit: Payer: Self-pay | Admitting: Family Medicine

## 2018-07-19 NOTE — Telephone Encounter (Signed)
Ok to refill Xanax??  Last office visit 05/14/2018.  Last refill 06/15/2018.

## 2018-08-13 ENCOUNTER — Other Ambulatory Visit: Payer: Self-pay | Admitting: Family Medicine

## 2018-08-13 NOTE — Telephone Encounter (Signed)
Ok to refill??  Last office visit 05/14/2018.  Last refill 07/19/2018.

## 2018-09-24 ENCOUNTER — Other Ambulatory Visit: Payer: Self-pay | Admitting: Family Medicine

## 2018-09-24 NOTE — Telephone Encounter (Signed)
Requesting refill      LOV: 05/14/18  LRF:  08/13/18

## 2018-09-28 ENCOUNTER — Telehealth: Payer: Self-pay | Admitting: Family Medicine

## 2018-09-28 ENCOUNTER — Other Ambulatory Visit: Payer: Self-pay | Admitting: Family Medicine

## 2018-09-28 MED ORDER — LISINOPRIL 10 MG PO TABS: ORAL_TABLET | ORAL | 0 refills | Status: DC

## 2018-09-28 MED ORDER — HYDROCHLOROTHIAZIDE 25 MG PO TABS: ORAL_TABLET | ORAL | 3 refills | Status: DC

## 2018-09-28 MED ORDER — METOPROLOL TARTRATE 25 MG PO TABS: 25.0000 mg | ORAL_TABLET | Freq: Two times a day (BID) | ORAL | 0 refills | Status: DC

## 2018-09-28 MED ORDER — SIMVASTATIN 40 MG PO TABS: 40.0000 mg | ORAL_TABLET | Freq: Every day | ORAL | 3 refills | Status: DC

## 2018-09-28 NOTE — Telephone Encounter (Signed)
Medication called/sent to requested pharmacy  
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
## 2018-10-31 ENCOUNTER — Encounter: Payer: Self-pay | Admitting: Family Medicine

## 2018-10-31 ENCOUNTER — Ambulatory Visit (INDEPENDENT_AMBULATORY_CARE_PROVIDER_SITE_OTHER): Payer: Medicare Other | Admitting: Family Medicine

## 2018-10-31 VITALS — BP 130/72 | HR 86 | Temp 97.6°F | Resp 15 | Ht 72.0 in | Wt 193.2 lb

## 2018-10-31 DIAGNOSIS — Z1342 Encounter for screening for global developmental delays (milestones): Secondary | ICD-10-CM

## 2018-10-31 DIAGNOSIS — R6889 Other general symptoms and signs: Secondary | ICD-10-CM

## 2018-10-31 DIAGNOSIS — Z1330 Encounter for screening examination for mental health and behavioral disorders, unspecified: Secondary | ICD-10-CM

## 2018-10-31 MED ORDER — PREDNISONE 20 MG PO TABS: ORAL_TABLET | ORAL | 0 refills | Status: AC

## 2018-10-31 MED ORDER — ALPRAZOLAM 0.5 MG PO TABS: 0.5000 mg | ORAL_TABLET | Freq: Three times a day (TID) | ORAL | 0 refills | Status: AC | PRN

## 2018-10-31 NOTE — Progress Notes (Signed)
Patient ID: Eddie Wilson, adult    DOB: 07/30/1941, 78 y.o.   MRN: [id number]  PCP: Susy Frizzle, MD  Chief Complaint  Patient presents with  . Dementia    Patient in today for a dementia test.     Subjective:   Eddie Wilson is a 78 y.o. 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 He sees Dr. Dennard Schaumann annually and also is seen by the [facility].  He has not had any past + screenings or referral to specialist for any neuro/psych/memory/demential sx.  He is still very active doing work in Architect.  He retired several years ago, but could not just sit at home so he went back to work, traveling all over, bidding on and working "million dollar jobs."    He also asked for a refill on prednisone in a taper for eye itching.  He is been managed by Dr. Dennard Schaumann for this for several years, he reports seeing several specialists, he is intolerant of antihistamines and anticholinergics because it dries him out too much with his tracheotomy.  He states that no other allergy medicines or eyedrops have worked and he does see eye specialist.  He does request refill on prednisone taper that last about 1 week.  They are very itchy with redness and little bit of swelling to his upper eyelids bilaterally.  He also asked for refill on his Xanax which she has been on for many many years has been taking 3 times a day and refilling almost every month for the past year.   Patient Active Problem List   Diagnosis Date Noted  . Throat cancer ([place])   . Hyperlipemia   . Hypertension   . CHF (congestive heart failure) ([place])   . CAD (coronary artery disease)      Prior to Admission medications   Medication Sig Start Date End Date Taking? Authorizing Provider  ALPRAZolam Duanne Moron) 0.5 MG tablet TAKE ONE TABLET BY MOUTH THREE TIMES DAILY as needed 09/25/18  Yes Susy Frizzle, MD  aspirin 325 MG buffered tablet Take 325 mg by  mouth daily.   Yes [provider]  hydrochlorothiazide (HYDRODIURIL) 25 MG tablet TAKE 1/2 TABLET BY MOUTH ONCE A DAY 09/28/18  Yes Susy Frizzle, MD  lisinopril (PRINIVIL,ZESTRIL) 10 MG tablet TAKE 1&1/2 TABLETS BY MOUTH DAILY 09/28/18  Yes Susy Frizzle, MD  metoprolol tartrate (LOPRESSOR) 25 MG tablet Take 1 tablet (25 mg total) by mouth 2 (two) times daily. 09/28/18  Yes Susy Frizzle, MD  simvastatin (ZOCOR) 40 MG tablet Take 1 tablet (40 mg total) by mouth daily. 09/28/18  Yes Susy Frizzle, MD  predniSONE (DELTASONE) 20 MG tablet Take 3 daily for 2 days, then 2 daily for 2 days, then 1 daily for 3 days. 10/31/18   Delsa Grana, PA-C     No Known Allergies   No family history on file.   Social History   Socioeconomic History  . Marital status: Single    Spouse name: Not on file  . Number of children: Not on file  . Years of education: Not on file  . Highest education level: Not on file  Occupational History  . Not on file  Social Needs  . Financial resource strain: Not on file  . Food insecurity:    Worry: Not on file    Inability: Not on file  .  Transportation needs:    Medical: Not on file    Non-medical: Not on file  Tobacco Use  . Smoking status: Former Research scientist (life sciences)  . Smokeless tobacco: Never Used  Substance and Sexual Activity  . Alcohol use: No  . Drug use: Not on file  . Sexual activity: Not on file  Lifestyle  . Physical activity:    Days per week: Not on file    Minutes per session: Not on file  . Stress: Not on file  Relationships  . Social connections:    Talks on phone: Not on file    Gets together: Not on file    Attends religious service: Not on file    Active member of club or organization: Not on file    Attends meetings of clubs or organizations: Not on file    Relationship status: Not on file  . Intimate partner violence:    Fear of current or ex partner: Not on file    Emotionally abused: Not on file    Physically abused:  Not on file    Forced sexual activity: Not on file  Other Topics Concern  . Not on file  Social History Narrative  . Not on file     Review of Systems  Constitutional: Negative.   HENT: Negative.   Eyes: Negative.   Respiratory: Negative.   Cardiovascular: Negative.   Gastrointestinal: Negative.   Endocrine: Negative.   Genitourinary: Negative.   Musculoskeletal: Negative.   Skin: Negative.   Allergic/Immunologic: Negative.   Neurological: Negative.   Hematological: Negative.   Psychiatric/Behavioral: Negative.   All other systems reviewed and are negative.      Objective:    Vitals:   10/31/18 1429  BP: 130/72  Pulse: 86  Resp: 15  Temp: 97.6 F (36.4 C)  TempSrc: Oral  SpO2: 98%  Weight: 193 lb 4 oz (87.7 kg)  Height: 6' (1.829 m)      Physical Exam Vitals signs and nursing note reviewed.  Constitutional:      General: She is not in acute distress.    Appearance: She is well-developed. She is not ill-appearing, toxic-appearing or diaphoretic.     Comments: Well appearing male, appears younger than stated age  HENT:     Head: Normocephalic and atraumatic.     Nose: Nose normal.  Eyes:     General:        Right eye: No discharge.        Left eye: No discharge.     Comments: Bilateral upper eyelid erythema with mild edema Conjunctiva b/l mildly injected with some scant watering No purulence No orbital edema/erythema  Neck:     Trachea: Tracheostomy present. No abnormal tracheal secretions or tracheal deviation.  Cardiovascular:     Rate and Rhythm: Normal rate and regular rhythm.  Pulmonary:     Effort: Pulmonary effort is normal. No respiratory distress.     Breath sounds: No stridor.  Musculoskeletal: Normal range of motion.  Skin:    General: Skin is warm and dry.     Findings: No rash.  Neurological:     General: No focal deficit present.     Mental Status: She is alert and oriented to person, place, and time.     Sensory: No sensory  deficit.     Motor: No weakness or abnormal muscle tone.     Coordination: Coordination normal.     Gait: Gait normal.  Psychiatric:  Mood and Affect: Mood normal.        Behavior: Behavior normal.        Thought Content: Thought content normal.        Judgment: Judgment normal.       10/31/18 1433  Mini Mental Exam  1. Orientation to time ( max 5 points ) 5  2. Orientation to Place ( max 5 points ) 5  3. Registration ( max 3 points ) 3  4. Attention / Calculation ( max 5 points ) 5  5. Recall ( max 3 points ) 3  6. Language-name 2 objects ( max 2 points ) 2  7. Language- repeat ( max 1 point ) 1  8. Language- follow 3 step command ( max 3 points ) 3  9. Language- read and follow direction ( max 1 point )  1  10. Write a sentence ( max 1 point ) 1  11. Copy design ( max 1 point ) 1  Total Score (max 30 points ) 30         Assessment & Plan:      ICD-10-CM   1. Itchy eyes R68.89   2. Screening for mental disease/developmental disorder Z13.30    Z13.42     Chronic eye condition - pt asks for refill of steroid taper - has failed multiple other tx, with traveling, weather changes and wind his eyes are itchy.  Refilled per hx of prednisone tapers  He is here for screening for demential as requested by an employer - MMSE negative -no otherwise patient is alert oriented grossly normal neurological evaluation, normal mood memory recall cognitive function and executive function, etc. do not see any history of dementia or positive screenings in the past with his Medicare well visits.  I written a letter to this effect for him to give to the requesting employer.  She also requested a refill of his Xanax I did verify the prescription history dosing and frequency and did refill since it has been more than a month since his last prescription  Delsa Grana, PA-C 10/31/18 3:17 PM

## 2018-10-31 NOTE — Progress Notes (Addendum)
   10/31/18 1433  Mini Mental Exam  1. Orientation to time ( max 5 points ) 5  2. Orientation to Place ( max 5 points ) 5  3. Registration ( max 3 points ) 3  4. Attention / Calculation ( max 5 points ) 5  5. Recall ( max 3 points ) 3  6. Language-name 2 objects ( max 2 points ) 2  7. Language- repeat ( max 1 point ) 1  8. Language- follow 3 step command ( max 3 points ) 3  9. Language- read and follow direction ( max 1 point )  1  10. Write a sentence ( max 1 point ) 1  11. Copy design ( max 1 point ) 1  Total Score (max 30 points ) 30

## 2018-11-15 ENCOUNTER — Telehealth: Payer: Self-pay | Admitting: Family Medicine

## 2018-11-15 NOTE — Telephone Encounter (Signed)
Patient needs all of his refills sent to [facility] in [place]  Their phone number is [phone]

## 2018-11-16 MED ORDER — HYDROCHLOROTHIAZIDE 25 MG PO TABS: ORAL_TABLET | ORAL | 1 refills | Status: AC

## 2018-11-16 MED ORDER — LISINOPRIL 10 MG PO TABS: ORAL_TABLET | ORAL | 1 refills | Status: AC

## 2018-11-16 MED ORDER — SIMVASTATIN 40 MG PO TABS: 40.0000 mg | ORAL_TABLET | Freq: Every day | ORAL | 1 refills | Status: AC

## 2018-11-16 MED ORDER — METOPROLOL TARTRATE 25 MG PO TABS: 25.0000 mg | ORAL_TABLET | Freq: Two times a day (BID) | ORAL | 1 refills | Status: AC

## 2018-11-16 NOTE — Telephone Encounter (Signed)
Medication called/sent to requested pharmacy  

## 2018-11-20 ENCOUNTER — Other Ambulatory Visit: Payer: Self-pay | Admitting: Family Medicine

## 2018-11-21 NOTE — Telephone Encounter (Signed)
Pt is in [place]  - will deny this refill. Please send

## 2019-05-01 ENCOUNTER — Ambulatory Visit: Payer: Self-pay | Admitting: Family Medicine

## 2023-04-17 ENCOUNTER — Telehealth: Payer: Self-pay | Admitting: Family Medicine

## 2023-04-17 NOTE — Telephone Encounter (Signed)
Outbound call placed to schedule appointment for patient to reestablish care with Dr. Tanya Nones. Patient stated he will call back to schedule.
